# Patient Record
Sex: Female | Born: 1935 | Race: White | Hispanic: No | Marital: Married | Smoking: Former smoker
Health system: Southern US, Community
[De-identification: ages and names within clinical notes are randomized; demographics above are authoritative.]

## PROBLEM LIST (undated history)

## (undated) DIAGNOSIS — I729 Aneurysm of unspecified site: Secondary | ICD-10-CM

## (undated) DIAGNOSIS — S99919A Unspecified injury of unspecified ankle, initial encounter: Secondary | ICD-10-CM

## (undated) HISTORY — DX: Aneurysm of unspecified site: I72.9

## (undated) HISTORY — DX: Unspecified injury of unspecified ankle, initial encounter: S99.919A

---

## 1982-05-11 HISTORY — PX: TOTAL ABDOMINAL HYSTERECTOMY: SHX209

## 2010-10-13 ENCOUNTER — Ambulatory Visit: Payer: Self-pay | Admitting: Internal Medicine

## 2011-10-14 DIAGNOSIS — Z0000 Encounter for general adult medical examination without abnormal findings: Secondary | ICD-10-CM | POA: Diagnosis not present

## 2012-06-16 IMAGING — CR DG CHEST 2V
1 series · 2 of 2 positions shown · non-contrast
Comparison: none

REASON FOR EXAM: abnormal chest exam
COMMENTS:

[Series 1: view not recorded · 0.17mm/px · 2 of 2 slices shown]
[im 1/2]
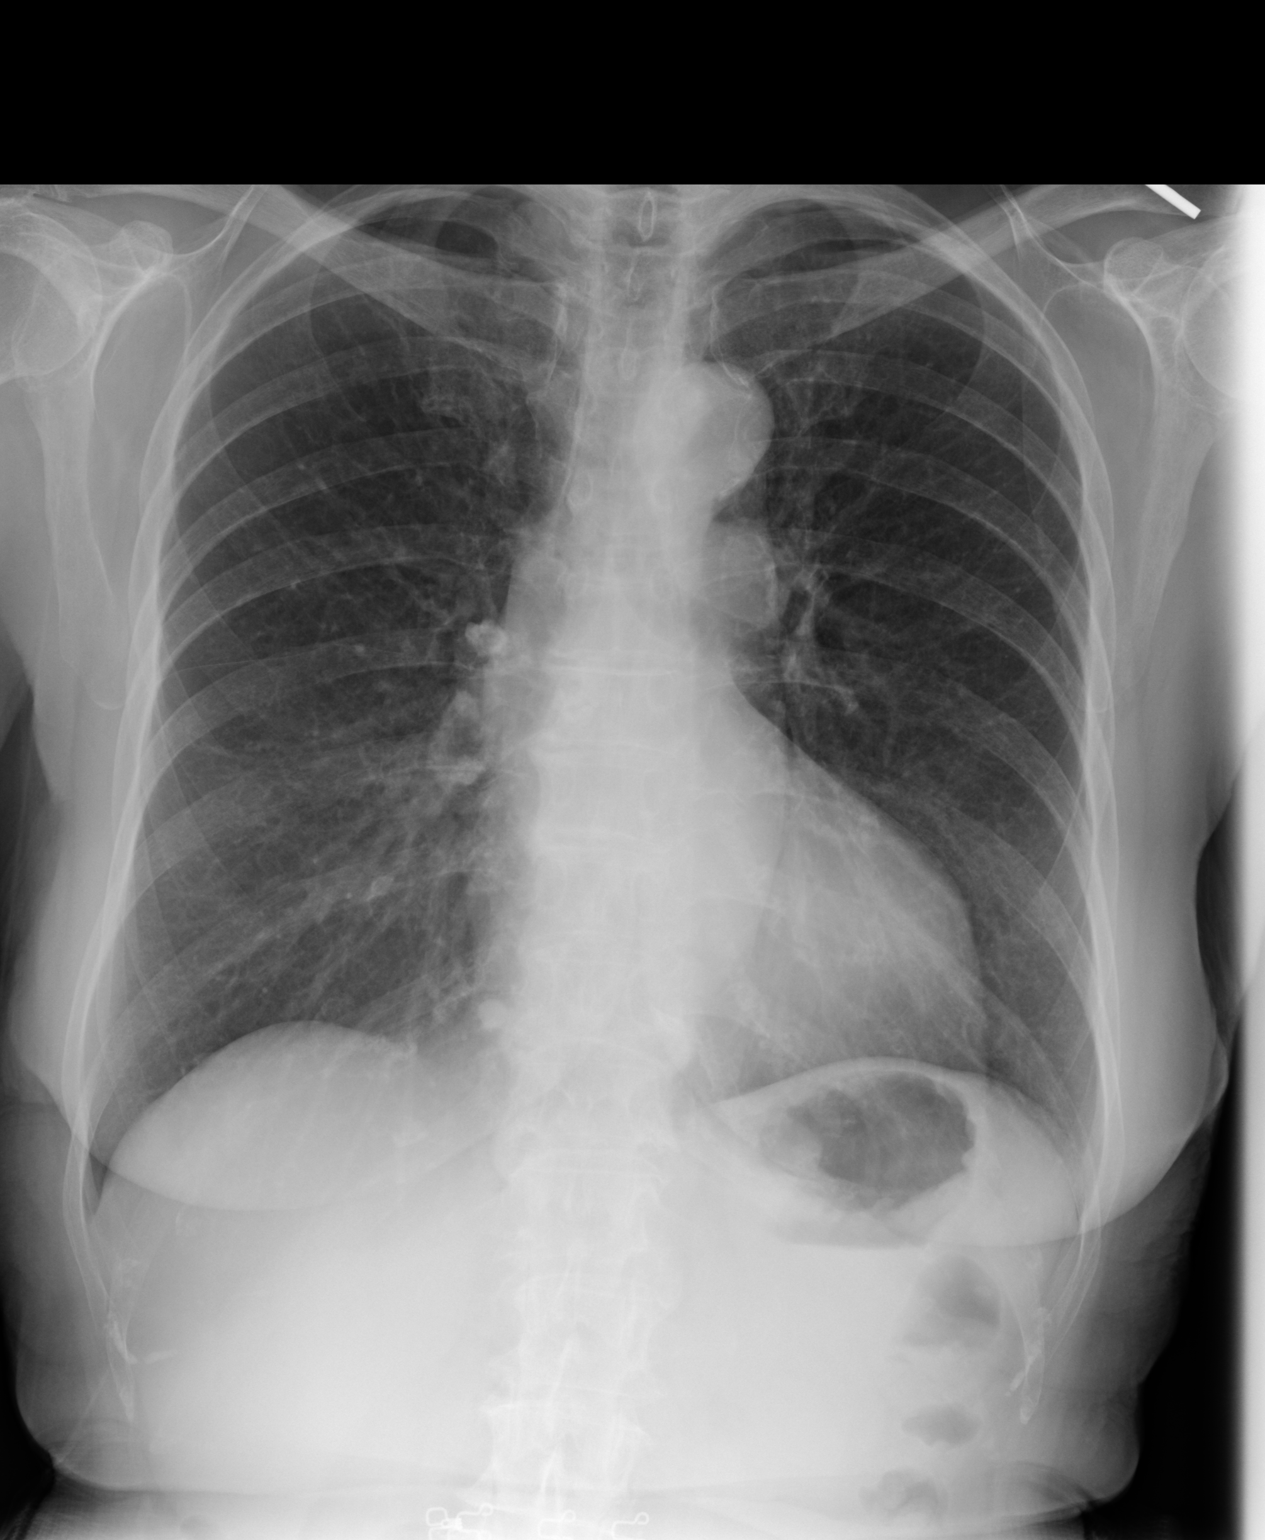
[im 2/2]
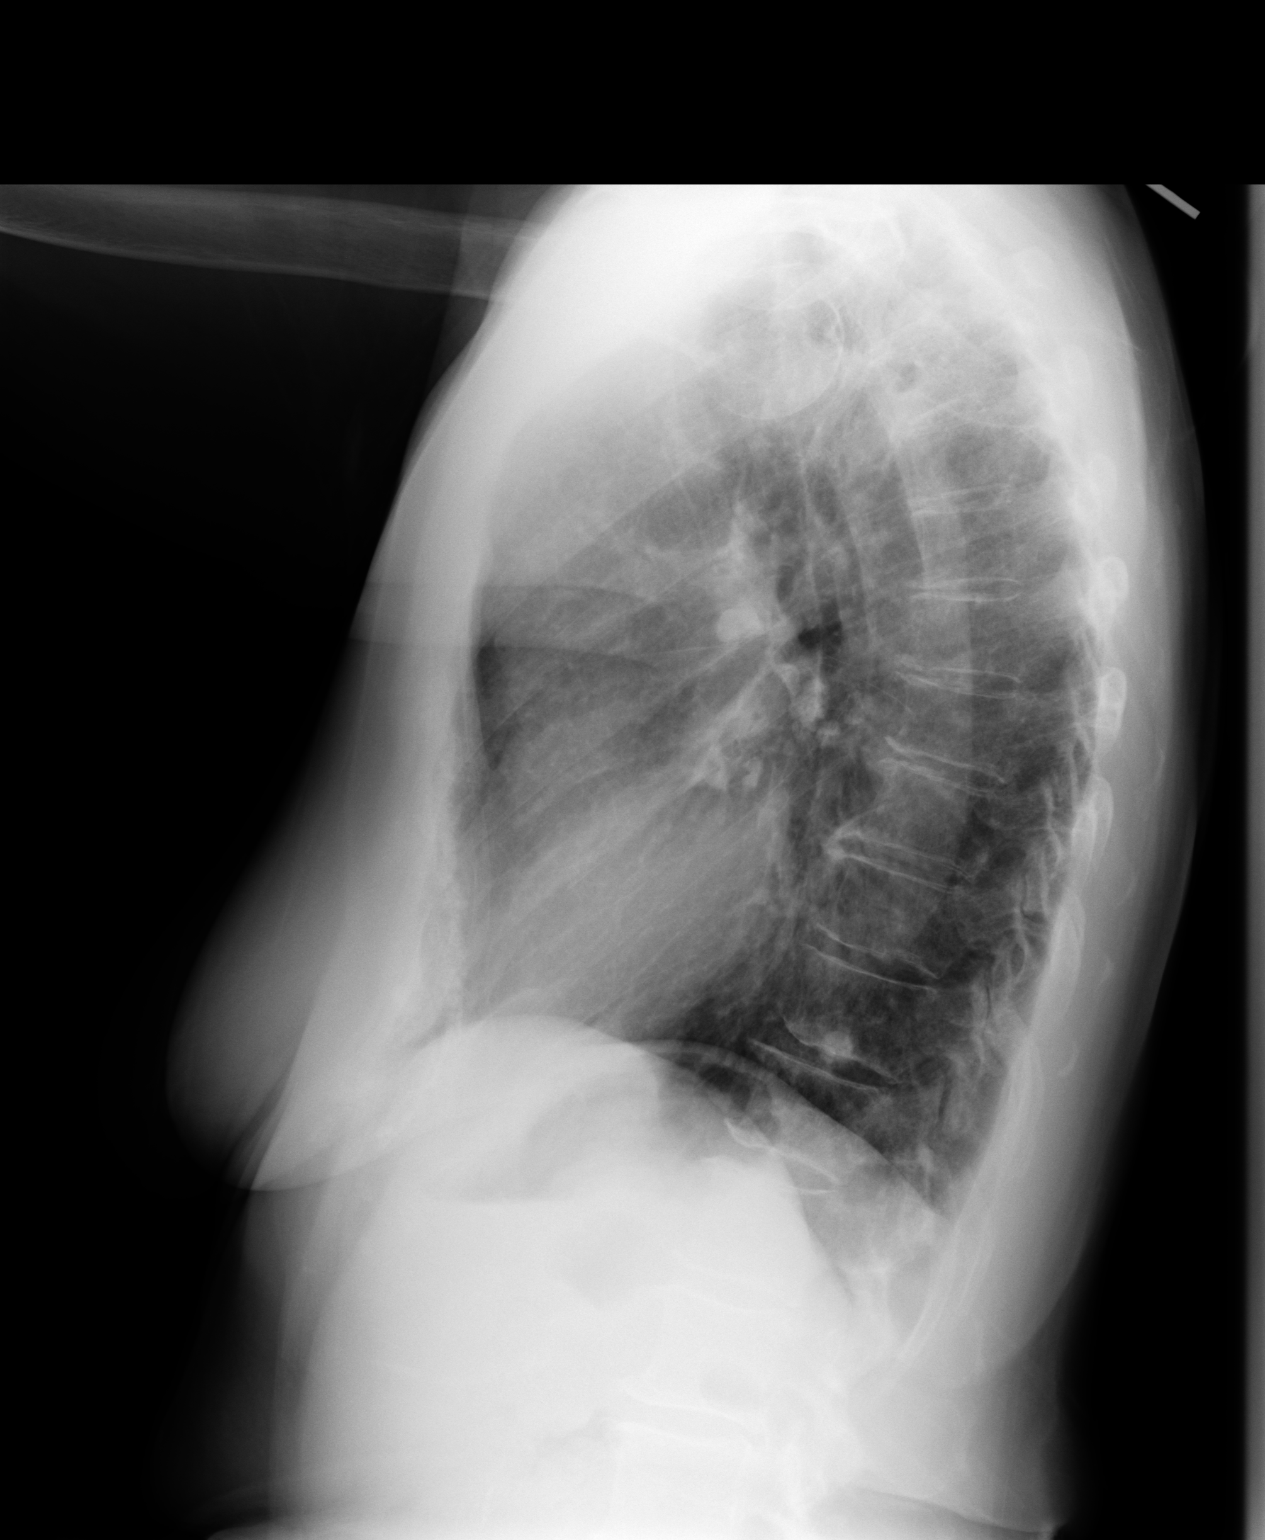

[2 of 2 positions shown; findings below may reference images not displayed]

PROCEDURE:     MDR - MDR CHEST PA(OR AP) AND LATERAL  - October 13, 2010 [DATE]

RESULT:     The lung fields are clear. No pneumonia, pneumothorax or pleural
effusion is seen. No acute changes of the heart or pulmonary vasculature are
identified. The osseous structures are normal in appearance.

Incidental note is made of a few calcified granulomas about the right hilum
and posteriorly in the right lung base.
IMPRESSION: 1.     No acute changes are identified.

## 2013-02-21 DIAGNOSIS — E785 Hyperlipidemia, unspecified: Secondary | ICD-10-CM | POA: Diagnosis not present

## 2013-02-21 DIAGNOSIS — R3129 Other microscopic hematuria: Secondary | ICD-10-CM | POA: Diagnosis not present

## 2013-02-21 DIAGNOSIS — I10 Essential (primary) hypertension: Secondary | ICD-10-CM | POA: Diagnosis not present

## 2013-02-21 DIAGNOSIS — Z23 Encounter for immunization: Secondary | ICD-10-CM | POA: Diagnosis not present

## 2013-02-21 DIAGNOSIS — Z0000 Encounter for general adult medical examination without abnormal findings: Secondary | ICD-10-CM | POA: Diagnosis not present

## 2013-03-16 DIAGNOSIS — Z1231 Encounter for screening mammogram for malignant neoplasm of breast: Secondary | ICD-10-CM | POA: Diagnosis not present

## 2013-05-01 DIAGNOSIS — H25099 Other age-related incipient cataract, unspecified eye: Secondary | ICD-10-CM | POA: Diagnosis not present

## 2013-06-20 DIAGNOSIS — I10 Essential (primary) hypertension: Secondary | ICD-10-CM | POA: Diagnosis not present

## 2014-02-23 DIAGNOSIS — E784 Other hyperlipidemia: Secondary | ICD-10-CM | POA: Diagnosis not present

## 2014-02-23 DIAGNOSIS — Z0000 Encounter for general adult medical examination without abnormal findings: Secondary | ICD-10-CM | POA: Diagnosis not present

## 2014-02-23 DIAGNOSIS — I10 Essential (primary) hypertension: Secondary | ICD-10-CM | POA: Diagnosis not present

## 2014-02-23 DIAGNOSIS — N289 Disorder of kidney and ureter, unspecified: Secondary | ICD-10-CM | POA: Diagnosis not present

## 2014-02-23 DIAGNOSIS — D485 Neoplasm of uncertain behavior of skin: Secondary | ICD-10-CM | POA: Diagnosis not present

## 2014-02-23 DIAGNOSIS — I639 Cerebral infarction, unspecified: Secondary | ICD-10-CM | POA: Diagnosis not present

## 2014-02-23 DIAGNOSIS — Z23 Encounter for immunization: Secondary | ICD-10-CM | POA: Diagnosis not present

## 2014-02-23 LAB — CBC AND DIFFERENTIAL: HEMOGLOBIN: 13.8 g/dL (ref 12.0–16.0)

## 2014-02-23 LAB — BASIC METABOLIC PANEL
BUN: 23 mg/dL — AB (ref 4–21)
Creatinine: 1.2 mg/dL — AB (ref <=1.1)

## 2014-02-23 LAB — TSH: TSH: 1.15 u[IU]/mL (ref <=5.90)

## 2014-03-23 DIAGNOSIS — Z1231 Encounter for screening mammogram for malignant neoplasm of breast: Secondary | ICD-10-CM | POA: Diagnosis not present

## 2014-03-23 LAB — HM MAMMOGRAPHY: HM MAMMO: NORMAL

## 2014-03-26 DIAGNOSIS — L570 Actinic keratosis: Secondary | ICD-10-CM | POA: Diagnosis not present

## 2014-03-26 DIAGNOSIS — D485 Neoplasm of uncertain behavior of skin: Secondary | ICD-10-CM | POA: Diagnosis not present

## 2014-03-26 DIAGNOSIS — X32XXXA Exposure to sunlight, initial encounter: Secondary | ICD-10-CM | POA: Diagnosis not present

## 2014-03-26 DIAGNOSIS — Z23 Encounter for immunization: Secondary | ICD-10-CM | POA: Diagnosis not present

## 2014-03-26 DIAGNOSIS — D225 Melanocytic nevi of trunk: Secondary | ICD-10-CM | POA: Diagnosis not present

## 2014-03-26 DIAGNOSIS — L821 Other seborrheic keratosis: Secondary | ICD-10-CM | POA: Diagnosis not present

## 2014-03-26 DIAGNOSIS — Z7189 Other specified counseling: Secondary | ICD-10-CM | POA: Diagnosis not present

## 2014-03-26 DIAGNOSIS — F17200 Nicotine dependence, unspecified, uncomplicated: Secondary | ICD-10-CM | POA: Diagnosis not present

## 2014-05-10 DIAGNOSIS — H527 Unspecified disorder of refraction: Secondary | ICD-10-CM | POA: Diagnosis not present

## 2014-05-10 DIAGNOSIS — H2513 Age-related nuclear cataract, bilateral: Secondary | ICD-10-CM | POA: Diagnosis not present

## 2014-10-18 DIAGNOSIS — D485 Neoplasm of uncertain behavior of skin: Secondary | ICD-10-CM | POA: Diagnosis not present

## 2014-10-18 DIAGNOSIS — D2261 Melanocytic nevi of right upper limb, including shoulder: Secondary | ICD-10-CM | POA: Diagnosis not present

## 2014-10-18 DIAGNOSIS — Z7189 Other specified counseling: Secondary | ICD-10-CM | POA: Diagnosis not present

## 2014-10-18 DIAGNOSIS — D225 Melanocytic nevi of trunk: Secondary | ICD-10-CM | POA: Diagnosis not present

## 2014-10-18 DIAGNOSIS — L821 Other seborrheic keratosis: Secondary | ICD-10-CM | POA: Diagnosis not present

## 2014-10-18 DIAGNOSIS — L820 Inflamed seborrheic keratosis: Secondary | ICD-10-CM | POA: Diagnosis not present

## 2015-02-24 ENCOUNTER — Other Ambulatory Visit: Payer: Self-pay | Admitting: Internal Medicine

## 2015-02-24 ENCOUNTER — Encounter: Payer: Self-pay | Admitting: Internal Medicine

## 2015-02-24 DIAGNOSIS — E785 Hyperlipidemia, unspecified: Secondary | ICD-10-CM | POA: Insufficient documentation

## 2015-02-24 DIAGNOSIS — Z720 Tobacco use: Secondary | ICD-10-CM | POA: Insufficient documentation

## 2015-02-24 DIAGNOSIS — I10 Essential (primary) hypertension: Secondary | ICD-10-CM | POA: Insufficient documentation

## 2015-02-24 DIAGNOSIS — G459 Transient cerebral ischemic attack, unspecified: Secondary | ICD-10-CM

## 2015-02-24 DIAGNOSIS — N289 Disorder of kidney and ureter, unspecified: Secondary | ICD-10-CM | POA: Insufficient documentation

## 2015-02-24 DIAGNOSIS — I749 Embolism and thrombosis of unspecified artery: Secondary | ICD-10-CM | POA: Insufficient documentation

## 2015-02-24 DIAGNOSIS — R319 Hematuria, unspecified: Secondary | ICD-10-CM

## 2015-02-24 DIAGNOSIS — N029 Recurrent and persistent hematuria with unspecified morphologic changes: Secondary | ICD-10-CM | POA: Insufficient documentation

## 2015-04-16 ENCOUNTER — Ambulatory Visit (INDEPENDENT_AMBULATORY_CARE_PROVIDER_SITE_OTHER): Payer: Medicare Other | Admitting: Internal Medicine

## 2015-04-16 ENCOUNTER — Encounter: Payer: Self-pay | Admitting: Internal Medicine

## 2015-04-16 VITALS — BP 138/88 | HR 80 | Ht 62.5 in | Wt 140.6 lb

## 2015-04-16 DIAGNOSIS — E785 Hyperlipidemia, unspecified: Secondary | ICD-10-CM

## 2015-04-16 DIAGNOSIS — I749 Embolism and thrombosis of unspecified artery: Secondary | ICD-10-CM | POA: Diagnosis not present

## 2015-04-16 DIAGNOSIS — N289 Disorder of kidney and ureter, unspecified: Secondary | ICD-10-CM | POA: Diagnosis not present

## 2015-04-16 DIAGNOSIS — Z0000 Encounter for general adult medical examination without abnormal findings: Secondary | ICD-10-CM | POA: Diagnosis not present

## 2015-04-16 DIAGNOSIS — G459 Transient cerebral ischemic attack, unspecified: Secondary | ICD-10-CM | POA: Diagnosis not present

## 2015-04-16 DIAGNOSIS — N029 Recurrent and persistent hematuria with unspecified morphologic changes: Secondary | ICD-10-CM | POA: Diagnosis not present

## 2015-04-16 DIAGNOSIS — Z720 Tobacco use: Secondary | ICD-10-CM

## 2015-04-16 DIAGNOSIS — I10 Essential (primary) hypertension: Secondary | ICD-10-CM

## 2015-04-16 DIAGNOSIS — I499 Cardiac arrhythmia, unspecified: Secondary | ICD-10-CM

## 2015-04-16 DIAGNOSIS — R319 Hematuria, unspecified: Secondary | ICD-10-CM

## 2015-04-16 MED ORDER — VALSARTAN-HYDROCHLOROTHIAZIDE 80-12.5 MG PO TABS: 1.0000 | ORAL_TABLET | Freq: Every day | ORAL | Status: DC

## 2015-04-16 NOTE — Progress Notes (Signed)
Patient: Diana Padilla, Female    DOB: 12/30/1934, 80 y.o.   MRN: [id number] Visit Date: 04/16/2015  Today's Provider: Halina Maidens, MD   Chief Complaint  Patient presents with  . Medicare Wellness  . Hypertension  . Hyperlipidemia   Subjective:    Annual wellness visit Diana Padilla is a 80 y.o. female who presents today for her Subsequent Annual Wellness Visit. She feels well. She reports exercising regularly - walking. She reports she is sleeping well. She remains active with housework, church and volunteer activities.  She denies breast problems and is due for a mammogram. Since her husband passed away, she no longer goes to [place] for the winter.  ----------------------------------------------------------- Hypertension This is a chronic problem. The current episode started more than 1 year ago. The problem is unchanged. The problem is controlled (range 120-130 syst). Associated symptoms include palpitations. Pertinent negatives include no chest pain, headaches, peripheral edema or shortness of breath. Past treatments include angiotensin blockers and diuretics. The current treatment provides significant improvement.  Hyperlipidemia This is a chronic problem. The current episode started more than 1 year ago. The problem is uncontrolled. She has no history of diabetes or hypothyroidism. Pertinent negatives include no chest pain or shortness of breath. Current antihyperlipidemic treatment includes diet change. Risk factors: hx of TIA.    Review of Systems  Constitutional: Negative for chills, diaphoresis, fatigue and unexpected weight change.  HENT: Positive for dental problem (receeding gums). Negative for hearing loss, tinnitus, trouble swallowing and voice change.   Eyes: Negative for visual disturbance.  Respiratory: Negative for choking, chest tightness and shortness of breath.   Cardiovascular: Positive for palpitations. Negative for chest pain and leg swelling.   Gastrointestinal: Negative for abdominal pain, diarrhea, constipation and blood in stool.  Genitourinary: Positive for urgency. Negative for dysuria, hematuria, vaginal bleeding and vaginal discharge.  Musculoskeletal: Positive for back pain.  Skin: Negative for color change and rash.  Neurological: Negative for tremors, seizures, speech difficulty, weakness, numbness and headaches.  Hematological: Negative for adenopathy. Does not bruise/bleed easily.  Psychiatric/Behavioral: Negative for sleep disturbance, dysphoric mood and decreased concentration.    Social History   Social History  . Marital Status: Married    Spouse Name: N/A  . Number of Children: N/A  . Years of Education: N/A   Occupational History  . Not on file.   Social History Main Topics  . Smoking status: Current Every Day Smoker  . Smokeless tobacco: Not on file  . Alcohol Use: No  . Drug Use: Not on file  . Sexual Activity: Not on file   Other Topics Concern  . Not on file   Social History Narrative    Patient Active Problem List   Diagnosis Date Noted  . Benign essential hematuria 02/24/2015  . Dyslipidemia 02/24/2015  . Current tobacco use 02/24/2015  . Essential (primary) hypertension 02/24/2015  . Transient ischemic attack due to embolism ([place]) 02/24/2015  . Impaired renal function 02/24/2015    Past Surgical History  Procedure Laterality Date  . Total abdominal hysterectomy  1984    fibroids    Her family history includes Parkinson's disease in her father.    Previous Medications   ASPIRIN 81 MG CHEWABLE TABLET    Chew 1 tablet by mouth daily.   CALCIUM CITRATE-VITAMIN D (CALCIUM + D PO)    Take 1 tablet by mouth daily.   MULTIPLE VITAMINS-MINERALS (CENTRUM SILVER ULTRA WOMENS) TABS    Take 1 tablet by mouth daily.  VITAMIN E 1000 UNIT CAPSULE    Take 1 capsule by mouth daily.    No care team member to display     Objective:   Vitals: BP 138/88 mmHg  Pulse 80  Ht 5' 2.5" (1.588  m)  Wt 140 lb 9.6 oz (63.776 kg)  BMI 25.29 kg/m2  Physical Exam  Constitutional: She is oriented to person, place, and time. She appears well-developed and well-nourished. No distress.  HENT:  Head: Normocephalic and atraumatic.  Right Ear: Tympanic membrane and ear canal normal.  Left Ear: Tympanic membrane and ear canal normal.  Nose: Right sinus exhibits no maxillary sinus tenderness. Left sinus exhibits no maxillary sinus tenderness.  Mouth/Throat: Uvula is midline and oropharynx is clear and moist.  Eyes: Conjunctivae and EOM are normal. Right eye exhibits no discharge. Left eye exhibits no discharge. No scleral icterus.  Neck: Normal range of motion. Carotid bruit is not present. No erythema present. No thyromegaly present.  Cardiovascular: Normal rate, regular rhythm, normal heart sounds, intact distal pulses and normal pulses.  Frequent extrasystoles are present.  Pulmonary/Chest: Effort normal and breath sounds normal. No respiratory distress. She has no wheezes. Right breast exhibits no mass, no nipple discharge, no skin change and no tenderness. Left breast exhibits no mass, no nipple discharge, no skin change and no tenderness.  Abdominal: Soft. Bowel sounds are normal. There is no hepatosplenomegaly. There is no tenderness. There is no CVA tenderness.  Musculoskeletal: Normal range of motion.  Lymphadenopathy:    She has no cervical adenopathy.    She has no axillary adenopathy.  Neurological: She is alert and oriented to person, place, and time. She has normal reflexes. No cranial nerve deficit or sensory deficit.  Skin: Skin is warm, dry and intact. No rash noted.  Psychiatric: She has a normal mood and affect. Her speech is normal and behavior is normal. Thought content normal. Cognition and memory are normal.  Nursing note and vitals reviewed.   Activities of Daily Living In your present state of health, do you have any difficulty performing the following activities:  04/16/2015  Hearing? N  Vision? N  Difficulty concentrating or making decisions? N  Walking or climbing stairs? N  Dressing or bathing? N  Doing errands, shopping? N    Fall Risk Assessment Fall Risk  04/16/2015  Falls in the past year? No     Patient reports there are safety devices in place in shower at home.   Depression Screen PHQ 2/9 Scores 04/16/2015  PHQ - 2 Score 0    Cognitive Testing - 6-CIT   Correct? Score   What year is it? yes 0 Yes = 0    No = 4  What month is it? yes 0 Yes = 0    No = 3  Remember:     Pia [address]au, [place], [place]     What time is it? yes 0 Yes = 0    No = 3  Count backwards from 20 to 1 yes 0 Correct = 0    1 error = 2   More than 1 error = 4  Say the months of the year in reverse. yes 0 Correct = 0    1 error = 2   More than 1 error = 4  What address did I ask you to remember? no 2 Correct = 0  1 error = 2    2 error = 4    3 error =  6    4 error = 8    All wrong = 10       TOTAL SCORE  2/28   Interpretation:  Normal  Normal (0-7) Abnormal (8-28)        Assessment & Plan:     Annual Wellness Visit  Reviewed patient's Family Medical History Reviewed and updated list of patient's medical providers Assessment of cognitive impairment was done Assessed patient's functional ability Established a written schedule for health screening [place] Completed and Reviewed  Exercise Activities and Dietary recommendations Goals    . Quit smoking / using tobacco       Immunization History  Administered Date(s) Administered  . Influenza-Unspecified 02/15/2015  . Pneumococcal Conjugate-13 02/23/2014  . Pneumococcal Polysaccharide-23 03/11/1998, 05/12/2002  . Zoster 05/12/1998    Health Maintenance  Topic Date Due  . Samul Dada  11/29/1953  . DEXA SCAN  11/28/2001  . INFLUENZA VACCINE  12/10/2015  . ZOSTAVAX  Addressed  . PNA vac Low Risk Adult  Completed     Discussed health benefits of physical  activity, and encouraged her to engage in regular exercise appropriate for her age and condition.    ------------------------------------------------------------------------------------------------------------ 1. Medicare annual wellness visit, subsequent Medicare wellness measures are satisfied - POCT urinalysis dipstick  2. Irregular heart beat EKG shows only frequent PACs otherwise sinus rhythm at 74 - EKG 12-Lead - TSH  3. Essential (primary) hypertension controlled - CBC with Differential/Platelet - Comprehensive metabolic panel - valsartan-hydrochlorothiazide (DIOVAN HCT) 80-12.5 MG tablet; Take 1 tablet by mouth daily.  Dispense: 90 tablet; Refill: 3  4. Transient ischemic attack due to embolism (HCC) No recurrent symptoms Continue daily aspirin therapy  5. Dyslipidemia Patient has been intolerant of all statin therapy Continue low-fat diet and daily fish oil - Lipid panel  6. Current tobacco use Smoking 4-5 cigarettes per day; patient strongly encouraged to quit completely  7. Impaired renal function We will check chemistries  8. Benign essential hematuria Stable without symptoms of infection   Halina Maidens, MD [facility]  04/16/2015

## 2015-04-16 NOTE — Patient Instructions (Addendum)
Health Maintenance  Topic Date Due  . TETANUS/TDAP  11/29/1955  . ZOSTAVAX  11/28/1996  . DEXA SCAN  11/28/2001  . INFLUENZA VACCINE  12/10/2015  . PNA vac Low Risk Adult  Completed   Breast Self-Awareness Practicing breast self-awareness may pick up problems early, prevent significant medical complications, and possibly save your life. By practicing breast self-awareness, you can become familiar with how your breasts look and feel and if your breasts are changing. This allows you to notice changes early. It can also offer you some reassurance that your breast health is good. One way to learn what is normal for your breasts and whether your breasts are changing is to do a breast self-exam. If you find a lump or something that was not present in the past, it is best to contact your caregiver right away. Other findings that should be evaluated by your caregiver include nipple discharge, especially if it is bloody; skin changes or reddening; areas where the skin seems to be pulled in (retracted); or new lumps and bumps. Breast pain is seldom associated with cancer (malignancy), but should also be evaluated by a caregiver. HOW TO PERFORM A BREAST SELF-EXAM The best time to examine your breasts is 5-7 days after your menstrual period is over. During menstruation, the breasts are lumpier, and it may be more difficult to pick up changes. If you do not menstruate, have reached menopause, or had your uterus removed (hysterectomy), you should examine your breasts at regular intervals, such as monthly. If you are breastfeeding, examine your breasts after a feeding or after using a breast pump. Breast implants do not decrease the risk for lumps or tumors, so continue to perform breast self-exams as recommended. Talk to your caregiver about how to determine the difference between the implant and breast tissue. Also, talk about the amount of pressure you should use during the exam. Over time, you will become more  familiar with the variations of your breasts and more comfortable with the exam. A breast self-exam requires you to remove all your clothes above the waist. 1. Look at your breasts and nipples. Stand in front of a mirror in a room with good lighting. With your hands on your hips, push your hands firmly downward. Look for a difference in shape, contour, and size from one breast to the other (asymmetry). Asymmetry includes puckers, dips, or bumps. Also, look for skin changes, such as reddened or scaly areas on the breasts. Look for nipple changes, such as discharge, dimpling, repositioning, or redness. 2. Carefully feel your breasts. This is best done either in the shower or tub while using soapy water or when flat on your back. Place the arm (on the side of the breast you are examining) above your head. Use the pads (not the fingertips) of your three middle fingers on your opposite hand to feel your breasts. Start in the underarm area and use  inch (2 cm) overlapping circles to feel your breast. Use 3 different levels of pressure (light, medium, and firm pressure) at each circle before moving to the next circle. The light pressure is needed to feel the tissue closest to the skin. The medium pressure will help to feel breast tissue a little deeper, while the firm pressure is needed to feel the tissue close to the ribs. Continue the overlapping circles, moving downward over the breast until you feel your ribs below your breast. Then, move one finger-width towards the center of the body. Continue to use the  inch (2 cm) overlapping circles to feel your breast as you move slowly up toward the collar bone (clavicle) near the base of the neck. Continue the up and down exam using all 3 pressures until you reach the middle of the chest. Do this with each breast, carefully feeling for lumps or changes. 3.  Keep a written record with breast changes or normal findings for each breast. By writing this information down, you  do not need to depend only on memory for size, tenderness, or location. Write down where you are in your menstrual cycle, if you are still menstruating. Breast tissue can have some lumps or thick tissue. However, see your caregiver if you find anything that concerns you.  SEEK MEDICAL CARE IF:  You see a change in shape, contour, or size of your breasts or nipples.   You see skin changes, such as reddened or scaly areas on the breasts or nipples.   You have an unusual discharge from your nipples.   You feel a new lump or unusually thick areas.    This information is not intended to replace advice given to you by your health care provider. Make sure you discuss any questions you have with your health care provider.   Document Released: 04/27/2005 Document Revised: 04/13/2012 Document Reviewed: 08/12/2011 Elsevier Interactive Patient Education [employer].

## 2015-04-17 LAB — TSH: TSH: 1.59 u[IU]/mL (ref 0.450–4.500)

## 2015-04-17 LAB — LIPID PANEL
CHOL/HDL RATIO: 3.3 ratio (ref 0.0–4.4)
CHOLESTEROL TOTAL: 228 mg/dL — AB (ref 100–199)
HDL: 69 mg/dL (ref >39)
LDL CALC: 142 mg/dL — AB (ref 0–99)
Triglycerides: 84 mg/dL (ref 0–149)
VLDL CHOLESTEROL CAL: 17 mg/dL (ref 5–40)

## 2015-04-17 LAB — COMPREHENSIVE METABOLIC PANEL
ALBUMIN: 4.3 g/dL (ref 3.5–4.8)
ALT: 12 IU/L (ref 0–32)
AST: 19 IU/L (ref 0–40)
Albumin/Globulin Ratio: 1.4 (ref 1.1–2.5)
Alkaline Phosphatase: 71 IU/L (ref 39–117)
BUN / CREAT RATIO: 19 (ref 11–26)
BUN: 21 mg/dL (ref 8–27)
Bilirubin Total: 0.7 mg/dL (ref 0.0–1.2)
CO2: 26 mmol/L (ref 18–29)
CREATININE: 1.08 mg/dL — AB (ref 0.57–1.00)
Calcium: 9.8 mg/dL (ref 8.7–10.3)
Chloride: 97 mmol/L (ref 97–106)
GFR, EST AFRICAN AMERICAN: 57 mL/min/{1.73_m2} — AB (ref >59)
GFR, EST NON AFRICAN AMERICAN: 49 mL/min/{1.73_m2} — AB (ref >59)
GLOBULIN, TOTAL: 3.1 g/dL (ref 1.5–4.5)
GLUCOSE: 93 mg/dL (ref 65–99)
Potassium: 3.6 mmol/L (ref 3.5–5.2)
SODIUM: 141 mmol/L (ref 136–144)
TOTAL PROTEIN: 7.4 g/dL (ref 6.0–8.5)

## 2015-04-17 LAB — CBC WITH DIFFERENTIAL/PLATELET
BASOS ABS: 0 10*3/uL (ref 0.0–0.2)
Basos: 0 %
EOS (ABSOLUTE): 0.1 10*3/uL (ref 0.0–0.4)
EOS: 2 %
HEMATOCRIT: 37.6 % (ref 34.0–46.6)
HEMOGLOBIN: 12.4 g/dL (ref 11.1–15.9)
Immature Grans (Abs): 0 10*3/uL (ref 0.0–0.1)
Immature Granulocytes: 0 %
LYMPHS ABS: 2.3 10*3/uL (ref 0.7–3.1)
Lymphs: 27 %
MCH: 29.1 pg (ref 26.6–33.0)
MCHC: 33 g/dL (ref 31.5–35.7)
MCV: 88 fL (ref 79–97)
MONOS ABS: 0.6 10*3/uL (ref 0.1–0.9)
Monocytes: 7 %
NEUTROS ABS: 5.3 10*3/uL (ref 1.4–7.0)
Neutrophils: 64 %
Platelets: 259 10*3/uL (ref 150–379)
RBC: 4.26 x10E6/uL (ref 3.77–5.28)
RDW: 14.1 % (ref 12.3–15.4)
WBC: 8.3 10*3/uL (ref 3.4–10.8)

## 2015-04-19 LAB — POCT URINALYSIS DIPSTICK
Bilirubin, UA: NEGATIVE
Blood, UA: NEGATIVE
Glucose, UA: NEGATIVE
KETONES UA: NEGATIVE
LEUKOCYTES UA: NEGATIVE
NITRITE UA: NEGATIVE
PROTEIN UA: NEGATIVE
Spec Grav, UA: <=1.005
UROBILINOGEN UA: 0.2
pH, UA: 5

## 2015-05-07 DIAGNOSIS — Z1231 Encounter for screening mammogram for malignant neoplasm of breast: Secondary | ICD-10-CM | POA: Diagnosis not present

## 2015-05-29 DIAGNOSIS — Z1389 Encounter for screening for other disorder: Secondary | ICD-10-CM | POA: Diagnosis not present

## 2015-05-29 DIAGNOSIS — Z8669 Personal history of other diseases of the nervous system and sense organs: Secondary | ICD-10-CM | POA: Diagnosis not present

## 2015-05-29 DIAGNOSIS — I10 Essential (primary) hypertension: Secondary | ICD-10-CM | POA: Diagnosis not present

## 2015-05-29 DIAGNOSIS — R0989 Other specified symptoms and signs involving the circulatory and respiratory systems: Secondary | ICD-10-CM | POA: Diagnosis not present

## 2015-05-29 DIAGNOSIS — R41841 Cognitive communication deficit: Secondary | ICD-10-CM | POA: Diagnosis not present

## 2015-05-29 DIAGNOSIS — E559 Vitamin D deficiency, unspecified: Secondary | ICD-10-CM | POA: Diagnosis not present

## 2015-05-29 DIAGNOSIS — F801 Expressive language disorder: Secondary | ICD-10-CM | POA: Diagnosis not present

## 2015-05-29 DIAGNOSIS — R1906 Epigastric swelling, mass or lump: Secondary | ICD-10-CM | POA: Diagnosis not present

## 2015-05-31 ENCOUNTER — Telehealth: Payer: Self-pay | Admitting: Internal Medicine

## 2015-05-31 ENCOUNTER — Encounter: Payer: Self-pay | Admitting: Internal Medicine

## 2015-05-31 NOTE — Telephone Encounter (Signed)
Telephone call from patient's daughter Hal Hope who is listed as a contact. Ms. Barbarito was seen at [facility] geriatrics evaluation clinic. Her blood pressure was fairly low at a systolic of 90. Because of her word finding difficulty they felt that her low blood pressure could be contributing. They did not want to adjust her medication but recommend that she follow-up with me. That appointment has not yet been scheduled. The geriatrician also felt that Diana Padilla had a pulsatile abdominal mass. Because of her smoking history and thought it should be further evaluated. I spoke with Candace regarding these issues and recommended that she hold her blood pressure medication. She should follow-up with me in 1 month to reassess blood pressure and to assess the possible abdominal mass.

## 2015-06-03 DIAGNOSIS — H527 Unspecified disorder of refraction: Secondary | ICD-10-CM | POA: Diagnosis not present

## 2015-06-03 DIAGNOSIS — H2513 Age-related nuclear cataract, bilateral: Secondary | ICD-10-CM | POA: Diagnosis not present

## 2015-06-12 DIAGNOSIS — H903 Sensorineural hearing loss, bilateral: Secondary | ICD-10-CM | POA: Diagnosis not present

## 2015-07-04 ENCOUNTER — Ambulatory Visit (INDEPENDENT_AMBULATORY_CARE_PROVIDER_SITE_OTHER): Payer: Medicare Other | Admitting: Internal Medicine

## 2015-07-04 ENCOUNTER — Encounter: Payer: Self-pay | Admitting: Internal Medicine

## 2015-07-04 VITALS — BP 133/80 | HR 73 | Ht 62.5 in | Wt 140.6 lb

## 2015-07-04 DIAGNOSIS — R1900 Intra-abdominal and pelvic swelling, mass and lump, unspecified site: Secondary | ICD-10-CM

## 2015-07-04 DIAGNOSIS — N289 Disorder of kidney and ureter, unspecified: Secondary | ICD-10-CM

## 2015-07-04 DIAGNOSIS — I749 Embolism and thrombosis of unspecified artery: Secondary | ICD-10-CM | POA: Diagnosis not present

## 2015-07-04 DIAGNOSIS — I10 Essential (primary) hypertension: Secondary | ICD-10-CM | POA: Diagnosis not present

## 2015-07-04 DIAGNOSIS — Z720 Tobacco use: Secondary | ICD-10-CM

## 2015-07-04 DIAGNOSIS — G459 Transient cerebral ischemic attack, unspecified: Secondary | ICD-10-CM

## 2015-07-04 MED ORDER — VALSARTAN 80 MG PO TABS: 80.0000 mg | ORAL_TABLET | Freq: Every day | ORAL | Status: DC

## 2015-07-04 NOTE — Progress Notes (Signed)
Date:  07/04/2015   Name:  Diana Padilla   DOB:  [birth year]   MRN:  [id number]   Chief Complaint: Follow-up and Hypertension Hypertension This is a chronic problem. The current episode started more than 1 year ago. The problem has been waxing and waning since onset. The problem is controlled (systolic bp around [id number] without medication). Pertinent negatives include no chest pain, headaches, palpitations or shortness of breath. Past treatments include angiotensin blockers and diuretics. The current treatment provides significant improvement. There are no compliance problems.    the patient was seen by geriatrics last month. Systolic blood pressure was 90. The patient was advised to reduce her medication by stopping it completely and follow-up with me. Geriatrics also thought she may have a pulsatile abdominal mass and recommended follow-up for that.  Renal insufficiency - avoiding nsaids.  She takes tylenol as needed.  History of TIA - recurrance of symptoms of numbness in right hand.  She does not think it was a TIA but review records suggested TIA.  Lab Results  Component Value Date   CREATININE 1.08* 04/16/2015    Review of Systems  Constitutional: Negative for fever, chills and fatigue.  Respiratory: Negative for cough, chest tightness and shortness of breath.   Cardiovascular: Positive for leg swelling. Negative for chest pain and palpitations.  Gastrointestinal: Negative for abdominal pain, diarrhea, constipation and blood in stool.  Genitourinary: Negative for difficulty urinating.  Musculoskeletal: Negative for arthralgias.  Skin: Negative for pallor and rash.  Neurological: Negative for dizziness, light-headedness and headaches.  Psychiatric/Behavioral: Negative for sleep disturbance and dysphoric mood.    Patient Active Problem List   Diagnosis Date Noted  . Benign essential hematuria 02/24/2015  . Dyslipidemia 02/24/2015  . Current tobacco use 02/24/2015  . Essential  (primary) hypertension 02/24/2015  . Transient ischemic attack due to embolism ([place]) 02/24/2015  . Impaired renal function 02/24/2015    Prior to Admission medications   Medication Sig Start Date End Date Taking? Authorizing Provider  aspirin 81 MG chewable tablet Chew 1 tablet by mouth daily.   Yes Historical Provider, MD  Calcium Citrate-Vitamin D (CALCIUM + D PO) Take 1 tablet by mouth daily.   Yes Historical Provider, MD  Multiple Vitamins-Minerals (CENTRUM SILVER ULTRA WOMENS) TABS Take 1 tablet by mouth daily.   Yes Historical Provider, MD  vitamin E 1000 UNIT capsule Take 1 capsule by mouth daily.   Yes Historical Provider, MD  valsartan-hydrochlorothiazide (DIOVAN HCT) 80-12.5 MG tablet Take 1 tablet by mouth daily. Patient not taking: Reported on 07/04/2015 04/16/15   Glean Hess, MD    Allergies  Allergen Reactions  . Ace Inhibitors Cough  . Amlodipine Swelling  . Morphine Nausea And Vomiting    Past Surgical History  Procedure Laterality Date  . Total abdominal hysterectomy  1984    fibroids    Social History  Substance Use Topics  . Smoking status: Current Every Day Smoker -- 0.25 packs/day for 50 years    Types: Cigarettes  . Smokeless tobacco: None  . Alcohol Use: No   Medication list has been reviewed and updated.   Physical Exam  Constitutional: She is oriented to person, place, and time. She appears well-developed. No distress.  HENT:  Head: Normocephalic and atraumatic.  Neck: Normal range of motion. Neck supple. No thyromegaly present.  Cardiovascular: Normal rate, regular rhythm and normal heart sounds.   Pulmonary/Chest: Effort normal and breath sounds normal. No respiratory distress.  Abdominal: Normal appearance  and bowel sounds are normal. She exhibits pulsatile midline mass. She exhibits no distension, no fluid wave and no abdominal bruit. There is no tenderness.  Musculoskeletal: Normal range of motion. She exhibits no edema or tenderness.    Lymphadenopathy:    She has no cervical adenopathy.  Neurological: She is alert and oriented to person, place, and time.  Skin: Skin is warm and dry. No rash noted.  Psychiatric: She has a normal mood and affect. Her behavior is normal. Thought content normal.    BP 133/80 mmHg  Pulse 73  Ht 5' 2.5" (1.588 m)  Wt 140 lb 9.6 oz (63.776 kg)  BMI 25.29 kg/m2  Assessment and Plan: 1. Essential (primary) hypertension Needs some control - resume valsartan alone - valsartan (DIOVAN) 80 MG tablet; Take 1 tablet (80 mg total) by mouth daily.  Dispense: 30 tablet; Refill: 5 - US Aorta; Future  2. Transient ischemic attack due to embolism (HCC) Continue aspirin daily  3. Impaired renal function Continue to monitor at intervals Patient reminded to take only tylenol as needed  4. Current tobacco use Order sent to [facility] to call patient - US Aorta; Future  5. Pulsatile abdominal mass May also need colonoscopy if [place] negative - patient has declined colonoscopy in the past - US Aorta; Future   Halina Maidens, MD [facility]  07/04/2015

## 2015-07-08 ENCOUNTER — Ambulatory Visit: Payer: Self-pay

## 2015-07-10 ENCOUNTER — Other Ambulatory Visit: Payer: Self-pay | Admitting: Internal Medicine

## 2015-07-10 DIAGNOSIS — I714 Abdominal aortic aneurysm, without rupture, unspecified: Secondary | ICD-10-CM

## 2015-07-10 DIAGNOSIS — I729 Aneurysm of unspecified site: Secondary | ICD-10-CM

## 2015-07-10 HISTORY — DX: Aneurysm of unspecified site: I72.9

## 2015-07-10 HISTORY — PX: ABDOMINAL AORTIC ANEURYSM REPAIR: SUR1152

## 2015-07-16 DIAGNOSIS — I701 Atherosclerosis of renal artery: Secondary | ICD-10-CM | POA: Diagnosis not present

## 2015-07-16 DIAGNOSIS — I714 Abdominal aortic aneurysm, without rupture: Secondary | ICD-10-CM | POA: Diagnosis not present

## 2015-07-19 DIAGNOSIS — I714 Abdominal aortic aneurysm, without rupture: Secondary | ICD-10-CM | POA: Diagnosis not present

## 2015-07-22 DIAGNOSIS — I714 Abdominal aortic aneurysm, without rupture: Secondary | ICD-10-CM | POA: Diagnosis not present

## 2015-07-25 DIAGNOSIS — Z87891 Personal history of nicotine dependence: Secondary | ICD-10-CM | POA: Diagnosis not present

## 2015-07-25 DIAGNOSIS — Z90710 Acquired absence of both cervix and uterus: Secondary | ICD-10-CM | POA: Diagnosis not present

## 2015-07-25 DIAGNOSIS — I10 Essential (primary) hypertension: Secondary | ICD-10-CM | POA: Diagnosis present

## 2015-07-25 DIAGNOSIS — I714 Abdominal aortic aneurysm, without rupture: Secondary | ICD-10-CM | POA: Diagnosis not present

## 2015-07-25 DIAGNOSIS — E785 Hyperlipidemia, unspecified: Secondary | ICD-10-CM | POA: Diagnosis present

## 2015-07-25 DIAGNOSIS — Z8673 Personal history of transient ischemic attack (TIA), and cerebral infarction without residual deficits: Secondary | ICD-10-CM | POA: Diagnosis not present

## 2015-08-22 DIAGNOSIS — I701 Atherosclerosis of renal artery: Secondary | ICD-10-CM | POA: Diagnosis not present

## 2015-08-22 DIAGNOSIS — I714 Abdominal aortic aneurysm, without rupture: Secondary | ICD-10-CM | POA: Diagnosis not present

## 2015-08-22 DIAGNOSIS — Z9889 Other specified postprocedural states: Secondary | ICD-10-CM | POA: Diagnosis not present

## 2015-09-11 DIAGNOSIS — S92352A Displaced fracture of fifth metatarsal bone, left foot, initial encounter for closed fracture: Secondary | ICD-10-CM | POA: Diagnosis not present

## 2015-09-24 DIAGNOSIS — S92352D Displaced fracture of fifth metatarsal bone, left foot, subsequent encounter for fracture with routine healing: Secondary | ICD-10-CM | POA: Diagnosis not present

## 2015-10-14 DIAGNOSIS — M810 Age-related osteoporosis without current pathological fracture: Secondary | ICD-10-CM | POA: Diagnosis not present

## 2015-10-16 ENCOUNTER — Ambulatory Visit (INDEPENDENT_AMBULATORY_CARE_PROVIDER_SITE_OTHER): Payer: Medicare Other | Admitting: Internal Medicine

## 2015-10-16 ENCOUNTER — Telehealth: Payer: Self-pay

## 2015-10-16 ENCOUNTER — Encounter: Payer: Self-pay | Admitting: Internal Medicine

## 2015-10-16 VITALS — BP 118/76 | HR 76 | Resp 16 | Ht 62.5 in | Wt 135.0 lb

## 2015-10-16 DIAGNOSIS — I749 Embolism and thrombosis of unspecified artery: Secondary | ICD-10-CM

## 2015-10-16 DIAGNOSIS — M810 Age-related osteoporosis without current pathological fracture: Secondary | ICD-10-CM | POA: Diagnosis not present

## 2015-10-16 DIAGNOSIS — Z8679 Personal history of other diseases of the circulatory system: Secondary | ICD-10-CM | POA: Diagnosis not present

## 2015-10-16 DIAGNOSIS — D649 Anemia, unspecified: Secondary | ICD-10-CM | POA: Diagnosis not present

## 2015-10-16 DIAGNOSIS — G459 Transient cerebral ischemic attack, unspecified: Secondary | ICD-10-CM | POA: Diagnosis not present

## 2015-10-16 DIAGNOSIS — Z9889 Other specified postprocedural states: Secondary | ICD-10-CM | POA: Diagnosis not present

## 2015-10-16 DIAGNOSIS — I10 Essential (primary) hypertension: Secondary | ICD-10-CM

## 2015-10-16 DIAGNOSIS — I714 Abdominal aortic aneurysm, without rupture, unspecified: Secondary | ICD-10-CM

## 2015-10-16 MED ORDER — ALENDRONATE SODIUM 70 MG PO TABS: 70.0000 mg | ORAL_TABLET | ORAL | Status: DC

## 2015-10-16 NOTE — Progress Notes (Signed)
Date:  10/16/2015   Name:  Diana Padilla   DOB:  06-22-1934   MRN:  [id number]   Chief Complaint: Hypertension Hypertension This is a chronic problem. The current episode started more than 1 year ago. The problem is unchanged. The problem is controlled. Pertinent negatives include no chest pain, headaches, palpitations or shortness of breath.   Osteoporosis - DEXA done earlier this month.  She fractured her left 5th metatarsal last month and is wearing a boot. She currently takes calcium and vitamin D but is unsure of the dose.  AAA aneurysm repair - she has done well since surgery.  No abdominal pain other than occasional stinging discomfort in right groin.  Bowels are moving normally. Her follow up CBC was slightly low at [facility] - I did not see this until after the visit concluded and Diana Padilla did not mention it.  Review of Systems  Constitutional: Negative for fever, chills, fatigue and unexpected weight change.  HENT: Negative for ear pain, tinnitus and trouble swallowing.   Eyes: Negative for visual disturbance.  Respiratory: Negative for cough, chest tightness and shortness of breath.   Cardiovascular: Negative for chest pain, palpitations and leg swelling.  Gastrointestinal: Negative for diarrhea, constipation and blood in stool.  Genitourinary: Negative for dysuria.  Musculoskeletal: Positive for joint swelling and arthralgias.  Neurological: Negative for dizziness, syncope, numbness and headaches.  Psychiatric/Behavioral: Negative for sleep disturbance and dysphoric mood.    Patient Active Problem List   Diagnosis Date Noted  . Abdominal aortic aneurysm greater than 39 mm in diameter ([place]) 07/10/2015  . Benign essential hematuria 02/24/2015  . Dyslipidemia 02/24/2015  . Current tobacco use 02/24/2015  . Essential (primary) hypertension 02/24/2015  . Transient ischemic attack due to embolism ([place]) 02/24/2015  . Impaired renal function 02/24/2015    Prior to Admission  medications   Medication Sig Start Date End Date Taking? Authorizing Provider  Acetaminophen 500 MG coapsule Take by mouth.   Yes Historical Provider, MD  aspirin 81 MG chewable tablet Chew 1 tablet by mouth daily.   Yes Historical Provider, MD  Calcium Citrate-Vitamin D (CALCIUM + D PO) Take 1 tablet by mouth daily.   Yes Historical Provider, MD  Multiple Vitamins-Minerals (CENTRUM SILVER ULTRA WOMENS) TABS Take 1 tablet by mouth daily.   Yes Historical Provider, MD  omega-3 acid ethyl esters (LOVAZA) 1 g capsule Take by mouth 2 (two) times daily.   Yes Historical Provider, MD  valsartan (DIOVAN) 80 MG tablet Take 1 tablet (80 mg total) by mouth daily. 07/04/15  Yes Glean Hess, MD  vitamin E 1000 UNIT capsule Take 1 capsule by mouth daily.   Yes Historical Provider, MD    Allergies  Allergen Reactions  . Ace Inhibitors Cough  . Amlodipine Swelling  . Morphine Nausea And Vomiting    Past Surgical History  Procedure Laterality Date  . Total abdominal hysterectomy  1984    fibroids    Social History  Substance Use Topics  . Smoking status: Current Every Day Smoker -- 0.25 packs/day for 50 years    Types: Cigarettes  . Smokeless tobacco: None  . Alcohol Use: No     Medication list has been reviewed and updated.   Physical Exam  Constitutional: She is oriented to person, place, and time. She appears well-developed and well-nourished. No distress.  HENT:  Head: Normocephalic and atraumatic.  Neck: Normal range of motion. Neck supple.  Cardiovascular: Normal rate, regular rhythm and normal  heart sounds.   Pulmonary/Chest: Effort normal and breath sounds normal. No respiratory distress. She has no wheezes. She has no rales.  Abdominal: Soft. Bowel sounds are normal. There is no tenderness.  Musculoskeletal: She exhibits no edema (right ankle; left ankle in surgical boot).  Lymphadenopathy:    She has no cervical adenopathy.  Neurological: She is alert and oriented to  person, place, and time.  Skin: Skin is warm and dry. No rash noted.  Psychiatric: She has a normal mood and affect. Her behavior is normal. Thought content normal.  Nursing note and vitals reviewed.   BP 118/76 mmHg  Pulse 76  Resp 16  Ht 5' 2.5" (1.588 m)  Wt 135 lb (61.236 kg)  BMI 24.28 kg/m2  SpO2 98%  Assessment and Plan: 1. Osteoporosis Begin adequate calcium and vitamin D - alendronate (FOSAMAX) 70 MG tablet; Take 1 tablet (70 mg total) by mouth every 7 (seven) days. Take with a full glass of water on an empty stomach.  Dispense: 12 tablet; Refill: 3  2. Essential (primary) hypertension controlled  3. Abdominal aortic aneurysm greater than 39 mm in diameter (H[facility]) Repaired - continue follow up with Vascular surgery for routine monitoring  4. Anemia, unspecified anemia type Recommend iron supplement and follow up visit in one month WBC (White Blood Cell Count) 4.2 3.2 - 9.8 x10^9/L  Hemoglobin 9.7 (L) 12.0 - 15.5 g/dL  Hematocrit 29.9 (L) 35.0 - 45.0 %  Plt (platelets) 169 150 - 450 x10^9/L  MCV (Mean Corpuscular Volume) 90 80 - 98 fL  MCH (Mean Corpuscular Hemoglobin) 29.1 26.5 - 34.0 pg  MCHC (Mean Corpuscular Hemoglobin Concentration) 32.4 31.4 - 36.0 %  RBC (Red Blood Cell Count) 3.33 (L) 3.77 - 5.16 x10^12/L  RDW-CV (Red Cell Distribution Width) 13.6 11.5 - 14.5 %  NRBC (Nucleated Red Blood Cell Count) 0.00 0.00 - 0.00 x10^9/L  NRBC % (Nucleated Red Blood Cell %) 0.0 %  MPV (Mean Platelet Volume) 11.0 7.2 - 11.7 fL     Halina Maidens, MD [facility]  10/16/2015

## 2015-10-16 NOTE — Patient Instructions (Addendum)
1600 mg calcium daily 800 IU vitamin D daily   Alendronate tablets What is this medicine? ALENDRONATE (a LEN droe nate) slows calcium loss from bones. It helps to make normal healthy bone and to slow bone loss in people with Paget's disease and osteoporosis. It may be used in others at risk for bone loss. This medicine may be used for other purposes; ask your health care provider or pharmacist if you have questions. What should I tell my health care provider before I take this medicine? They need to know if you have any of these conditions: -dental disease -esophagus, stomach, or intestine problems, like acid reflux or GERD -kidney disease -low blood calcium -low vitamin D -problems sitting or standing 30 minutes -trouble swallowing -an unusual or allergic reaction to alendronate, other medicines, foods, dyes, or preservatives -pregnant or trying to get pregnant -breast-feeding How should I use this medicine? You must take this medicine exactly as directed or you will lower the amount of the medicine you absorb into your body or you may cause yourself harm. Take this medicine by mouth first thing in the morning, after you are up for the day. Do not eat or drink anything before you take your medicine. Swallow the tablet with a full glass (6 to 8 fluid ounces) of plain water. Do not take this medicine with any other drink. Do not chew or crush the tablet. After taking this medicine, do not eat breakfast, drink, or take any medicines or vitamins for at least 30 minutes. Sit or stand up for at least 30 minutes after you take this medicine; do not lie down. Do not take your medicine more often than directed. Talk to your pediatrician regarding the use of this medicine in children. Special care may be needed. Overdosage: If you think you have taken too much of this medicine contact a poison control center or emergency room at once. NOTE: This medicine is only for you. Do not share this medicine  with others. What if I miss a dose? If you miss a dose, do not take it later in the day. Continue your normal schedule starting the next morning. Do not take double or extra doses. What may interact with this medicine? -aluminum hydroxide -antacids -aspirin -calcium supplements -drugs for inflammation like ibuprofen, naproxen, and others -iron supplements -magnesium supplements -vitamins with minerals This list may not describe all possible interactions. Give your health care provider a list of all the medicines, herbs, non-prescription drugs, or dietary supplements you use. Also tell them if you smoke, drink alcohol, or use illegal drugs. Some items may interact with your medicine. What should I watch for while using this medicine? Visit your doctor or health care professional for regular checks ups. It may be some time before you see benefit from this medicine. Do not stop taking your medicine except on your doctor's advice. Your doctor or health care professional may order blood tests and other tests to see how you are doing. You should make sure you get enough calcium and vitamin D while you are taking this medicine, unless your doctor tells you not to. Discuss the foods you eat and the vitamins you take with your health care professional. Some people who take this medicine have severe bone, joint, and/or muscle pain. This medicine may also increase your risk for a broken thigh bone. Tell your doctor right away if you have pain in your upper leg or groin. Tell your doctor if you have any pain that does  not go away or that gets worse. This medicine can make you more sensitive to the sun. If you get a rash while taking this medicine, sunlight may cause the rash to get worse. Keep out of the sun. If you cannot avoid being in the sun, wear protective clothing and use sunscreen. Do not use sun lamps or tanning beds/booths. What side effects may I notice from receiving this medicine? Side effects  that you should report to your doctor or health care professional as soon as possible: -allergic reactions like skin rash, itching or hives, swelling of the face, lips, or tongue -black or tarry stools -bone, muscle or joint pain -changes in vision -chest pain -heartburn or stomach pain -jaw pain, especially after dental work -pain or trouble when swallowing -redness, blistering, peeling or loosening of the skin, including inside the mouth Side effects that usually do not require medical attention (report to your doctor or health care professional if they continue or are bothersome): -changes in taste -diarrhea or constipation -eye pain or itching -headache -nausea or vomiting -stomach gas or fullness This list may not describe all possible side effects. Call your doctor for medical advice about side effects. You may report side effects to FDA at 1-800-FDA-1088. Where should I keep my medicine? Keep out of the reach of children. Store at room temperature of 15 and 30 degrees C (59 and 86 degrees F). Throw away any unused medicine after the expiration date. NOTE: This sheet is a summary. It may not cover all possible information. If you have questions about this medicine, talk to your doctor, pharmacist, or health care provider.    2016, Elsevier/Gold Standard. (2010-10-24 08:56:09)

## 2015-11-01 NOTE — Telephone Encounter (Signed)
ERROR

## 2015-11-05 DIAGNOSIS — M810 Age-related osteoporosis without current pathological fracture: Secondary | ICD-10-CM | POA: Diagnosis not present

## 2015-11-05 DIAGNOSIS — S92352D Displaced fracture of fifth metatarsal bone, left foot, subsequent encounter for fracture with routine healing: Secondary | ICD-10-CM | POA: Diagnosis not present

## 2015-11-18 ENCOUNTER — Ambulatory Visit (INDEPENDENT_AMBULATORY_CARE_PROVIDER_SITE_OTHER): Payer: Medicare Other | Admitting: Internal Medicine

## 2015-11-18 ENCOUNTER — Encounter: Payer: Self-pay | Admitting: Internal Medicine

## 2015-11-18 VITALS — BP 127/78 | HR 67 | Resp 16 | Wt 143.0 lb

## 2015-11-18 DIAGNOSIS — M810 Age-related osteoporosis without current pathological fracture: Secondary | ICD-10-CM

## 2015-11-18 DIAGNOSIS — G459 Transient cerebral ischemic attack, unspecified: Secondary | ICD-10-CM

## 2015-11-18 DIAGNOSIS — I749 Embolism and thrombosis of unspecified artery: Secondary | ICD-10-CM | POA: Diagnosis not present

## 2015-11-18 DIAGNOSIS — Z720 Tobacco use: Secondary | ICD-10-CM

## 2015-11-18 DIAGNOSIS — D649 Anemia, unspecified: Secondary | ICD-10-CM

## 2015-11-18 DIAGNOSIS — I10 Essential (primary) hypertension: Secondary | ICD-10-CM

## 2015-11-18 DIAGNOSIS — F17200 Nicotine dependence, unspecified, uncomplicated: Secondary | ICD-10-CM

## 2015-11-18 NOTE — Patient Instructions (Signed)
Smoking Cessation, Tips for Success If you are ready to quit smoking, congratulations! You have chosen to help yourself be healthier. Cigarettes bring nicotine, tar, carbon monoxide, and other irritants into your body. Your lungs, heart, and blood vessels will be able to work better without these poisons. There are many different ways to quit smoking. Nicotine gum, nicotine patches, a nicotine inhaler, or nicotine nasal spray can help with physical craving. Hypnosis, support groups, and medicines help break the habit of smoking. WHAT THINGS CAN I DO TO MAKE QUITTING EASIER?  Here are some tips to help you quit for good:  Pick a date when you will quit smoking completely. Tell all of your friends and family about your plan to quit on that date.  Do not try to slowly cut down on the number of cigarettes you are smoking. Pick a quit date and quit smoking completely starting on that day.  Throw away all cigarettes.   Clean and remove all ashtrays from your home, work, and car.  On a card, write down your reasons for quitting. Carry the card with you and read it when you get the urge to smoke.  Cleanse your body of nicotine. Drink enough water and fluids to keep your urine clear or pale yellow. Do this after quitting to flush the nicotine from your body.  Learn to predict your moods. Do not let a bad situation be your excuse to have a cigarette. Some situations in your life might tempt you into wanting a cigarette.  Never have "just one" cigarette. It leads to wanting another and another. Remind yourself of your decision to quit.  Change habits associated with smoking. If you smoked while driving or when feeling stressed, try other activities to replace smoking. Stand up when drinking your coffee. Brush your teeth after eating. Sit in a different chair when you read the paper. Avoid alcohol while trying to quit, and try to drink fewer caffeinated beverages. Alcohol and caffeine may urge you to  smoke.  Avoid foods and drinks that can trigger a desire to smoke, such as sugary or spicy foods and alcohol.  Ask people who smoke not to smoke around you.  Have something planned to do right after eating or having a cup of coffee. For example, plan to take a walk or exercise.  Try a relaxation exercise to calm you down and decrease your stress. Remember, you may be tense and nervous for the first 2 weeks after you quit, but this will pass.  Find new activities to keep your hands busy. Play with a pen, coin, or rubber band. Doodle or draw things on paper.  Brush your teeth right after eating. This will help cut down on the craving for the taste of tobacco after meals. You can also try mouthwash.   Use oral substitutes in place of cigarettes. Try using lemon drops, carrots, cinnamon sticks, or chewing gum. Keep them handy so they are available when you have the urge to smoke.  When you have the urge to smoke, try deep breathing.  Designate your home as a nonsmoking area.  If you are a heavy smoker, ask your health care provider about a prescription for nicotine chewing gum. It can ease your withdrawal from nicotine.  Reward yourself. Set aside the cigarette money you save and buy yourself something nice.  Look for support from others. Join a support group or smoking cessation program. Ask someone at home or at work to help you with your plan   to quit smoking.  Always ask yourself, "Do I need this cigarette or is this just a reflex?" Tell yourself, "Today, I choose not to smoke," or "I do not want to smoke." You are reminding yourself of your decision to quit.  Do not replace cigarette smoking with electronic cigarettes (commonly called e-cigarettes). The safety of e-cigarettes is unknown, and some may contain harmful chemicals.  If you relapse, do not give up! Plan ahead and think about what you will do the next time you get the urge to smoke. HOW WILL I FEEL WHEN I QUIT SMOKING? You  may have symptoms of withdrawal because your body is used to nicotine (the addictive substance in cigarettes). You may crave cigarettes, be irritable, feel very hungry, cough often, get headaches, or have difficulty concentrating. The withdrawal symptoms are only temporary. They are strongest when you first quit but will go away within 10-14 days. When withdrawal symptoms occur, stay in control. Think about your reasons for quitting. Remind yourself that these are signs that your body is healing and getting used to being without cigarettes. Remember that withdrawal symptoms are easier to treat than the major diseases that smoking can cause.  Even after the withdrawal is over, expect periodic urges to smoke. However, these cravings are generally short lived and will go away whether you smoke or not. Do not smoke! WHAT RESOURCES ARE AVAILABLE TO HELP ME QUIT SMOKING? Your health care provider can direct you to community resources or hospitals for support, which may include:  Group support.  Education.  Hypnosis.  Therapy.   This information is not intended to replace advice given to you by your health care provider. Make sure you discuss any questions you have with your health care provider.   Document Released: 01/24/2004 Document Revised: 05/18/2014 Document Reviewed: 10/13/2012 Elsevier Interactive Patient Education 2016 Elsevier Inc.  

## 2015-11-18 NOTE — Progress Notes (Signed)
Date:  11/18/2015   Name:  Diana Padilla   DOB:  1934-09-17   MRN:  [id number]   Chief Complaint: Hypertension Hypertension This is a chronic problem. The current episode started more than 1 year ago. The problem is unchanged. The problem is controlled. Pertinent negatives include no chest pain, palpitations or shortness of breath.   Post op anemia - started on iron last visit. She is tolerating it well with no constipation.  Energy level is good.  No chest pain or shortness of breath.  Osteoporosis and recent foot fracture - healed.  Now out of cast and having no pain.  Started Fosamax last week.  No complaints or questions.  She continues on calcium and vitamin D.  Review of Systems  Constitutional: Negative for fever, chills and fatigue.  Respiratory: Negative for cough, chest tightness and shortness of breath.   Cardiovascular: Negative for chest pain, palpitations and leg swelling.  Gastrointestinal: Negative for abdominal pain, constipation and blood in stool.  Musculoskeletal: Negative for arthralgias and gait problem.  Hematological: Negative for adenopathy.  Psychiatric/Behavioral: Negative for dysphoric mood.    Patient Active Problem List   Diagnosis Date Noted  . Osteoporosis 10/16/2015  . Absolute anemia 10/16/2015  . Status post percutaneous abdominal aortic aneurysm (AAA) repair 10/16/2015  . Abdominal aortic aneurysm greater than 39 mm in diameter ([place]) 07/10/2015  . Benign essential hematuria 02/24/2015  . Dyslipidemia 02/24/2015  . Current tobacco use 02/24/2015  . Essential (primary) hypertension 02/24/2015  . Transient ischemic attack due to embolism ([place]) 02/24/2015  . Impaired renal function 02/24/2015  . HLD (hyperlipidemia) 02/24/2015  . Tobacco use 02/24/2015    Prior to Admission medications   Medication Sig Start Date End Date Taking? Authorizing Provider  Acetaminophen 500 MG coapsule Take by mouth.   Yes Historical Provider, MD  alendronate  (FOSAMAX) 70 MG tablet Take 1 tablet (70 mg total) by mouth every 7 (seven) days. Take with a full glass of water on an empty stomach. 10/16/15  Yes Glean Hess, MD  aspirin 81 MG chewable tablet Chew 1 tablet by mouth daily.   Yes Historical Provider, MD  Calcium Citrate-Vitamin D (CALCIUM + D PO) Take 1 tablet by mouth daily.   Yes Historical Provider, MD  Multiple Vitamins-Minerals (CENTRUM SILVER ULTRA WOMENS) TABS Take 1 tablet by mouth daily.   Yes Historical Provider, MD  omega-3 acid ethyl esters (LOVAZA) 1 g capsule Take by mouth 2 (two) times daily.   Yes Historical Provider, MD  valsartan (DIOVAN) 80 MG tablet Take 1 tablet (80 mg total) by mouth daily. 07/04/15  Yes Glean Hess, MD  vitamin E 1000 UNIT capsule Take 1 capsule by mouth daily.   Yes Historical Provider, MD    Allergies  Allergen Reactions  . Ace Inhibitors Cough  . Amlodipine Swelling  . Morphine Nausea And Vomiting    Past Surgical History  Procedure Laterality Date  . Total abdominal hysterectomy  1984    fibroids  . Abdominal aortic aneurysm repair  07/2015    Social History  Substance Use Topics  . Smoking status: Current Every Day Smoker -- 0.25 packs/day for 50 years    Types: Cigarettes  . Smokeless tobacco: None  . Alcohol Use: No     Medication list has been reviewed and updated.   Physical Exam  Constitutional: She is oriented to person, place, and time. She appears well-developed. No distress.  HENT:  Head: Normocephalic and atraumatic.  Neck: Normal range of motion. Neck supple.  Cardiovascular: Normal rate, regular rhythm and normal heart sounds.   Pulmonary/Chest: Effort normal and breath sounds normal. No respiratory distress. She has no wheezes.  Abdominal: Soft. Bowel sounds are normal. There is no tenderness.  Musculoskeletal: She exhibits no edema or tenderness.  Neurological: She is alert and oriented to person, place, and time.  Skin: Skin is warm and dry. No rash  noted.  Psychiatric: She has a normal mood and affect. Her behavior is normal. Thought content normal.  Nursing note and vitals reviewed.   BP 127/78 mmHg  Pulse 67  Resp 16  Wt 143 lb (64.864 kg)  SpO2 97%  Assessment and Plan: 1. Smoker Encouraged to quit completely - Nurse to provide smoking / tobacco cessation education  2. Essential (primary) hypertension controlled  3. Osteoporosis Now on Fosamax plus Calcium + D  4. Tobacco use As above  5. Anemia, unspecified anemia type Continue iron - will advise if she can discontinue - CBC with Differential/Platelet   Halina Maidens, MD [facility]  11/18/2015

## 2015-11-19 LAB — CBC WITH DIFFERENTIAL/PLATELET
BASOS ABS: 0 10*3/uL (ref 0.0–0.2)
Basos: 0 %
EOS (ABSOLUTE): 0.3 10*3/uL (ref 0.0–0.4)
Eos: 3 %
HEMOGLOBIN: 12.7 g/dL (ref 11.1–15.9)
Hematocrit: 39.9 % (ref 34.0–46.6)
IMMATURE GRANS (ABS): 0 10*3/uL (ref 0.0–0.1)
IMMATURE GRANULOCYTES: 0 %
LYMPHS: 29 %
Lymphocytes Absolute: 2.6 10*3/uL (ref 0.7–3.1)
MCH: 27.9 pg (ref 26.6–33.0)
MCHC: 31.8 g/dL (ref 31.5–35.7)
MCV: 88 fL (ref 79–97)
MONOCYTES: 6 %
Monocytes Absolute: 0.5 10*3/uL (ref 0.1–0.9)
NEUTROS ABS: 5.6 10*3/uL (ref 1.4–7.0)
NEUTROS PCT: 62 %
PLATELETS: 266 10*3/uL (ref 150–379)
RBC: 4.56 x10E6/uL (ref 3.77–5.28)
RDW: 14.9 % (ref 12.3–15.4)
WBC: 9.1 10*3/uL (ref 3.4–10.8)

## 2015-11-21 DIAGNOSIS — Z8679 Personal history of other diseases of the circulatory system: Secondary | ICD-10-CM | POA: Diagnosis not present

## 2015-11-21 DIAGNOSIS — Z95828 Presence of other vascular implants and grafts: Secondary | ICD-10-CM | POA: Diagnosis not present

## 2015-11-21 DIAGNOSIS — Z9889 Other specified postprocedural states: Secondary | ICD-10-CM | POA: Diagnosis not present

## 2016-02-21 DIAGNOSIS — Z23 Encounter for immunization: Secondary | ICD-10-CM | POA: Diagnosis not present

## 2016-04-16 ENCOUNTER — Encounter: Payer: Self-pay | Admitting: Internal Medicine

## 2016-04-16 ENCOUNTER — Ambulatory Visit (INDEPENDENT_AMBULATORY_CARE_PROVIDER_SITE_OTHER): Payer: Medicare Other | Admitting: Internal Medicine

## 2016-04-16 VITALS — BP 110/73 | HR 68 | Resp 16 | Ht 62.5 in | Wt 138.0 lb

## 2016-04-16 DIAGNOSIS — Z0000 Encounter for general adult medical examination without abnormal findings: Secondary | ICD-10-CM

## 2016-04-16 DIAGNOSIS — Z1231 Encounter for screening mammogram for malignant neoplasm of breast: Secondary | ICD-10-CM

## 2016-04-16 DIAGNOSIS — I10 Essential (primary) hypertension: Secondary | ICD-10-CM | POA: Diagnosis not present

## 2016-04-16 DIAGNOSIS — I749 Embolism and thrombosis of unspecified artery: Secondary | ICD-10-CM | POA: Diagnosis not present

## 2016-04-16 DIAGNOSIS — M810 Age-related osteoporosis without current pathological fracture: Secondary | ICD-10-CM | POA: Diagnosis not present

## 2016-04-16 DIAGNOSIS — G459 Transient cerebral ischemic attack, unspecified: Secondary | ICD-10-CM | POA: Diagnosis not present

## 2016-04-16 DIAGNOSIS — N289 Disorder of kidney and ureter, unspecified: Secondary | ICD-10-CM | POA: Diagnosis not present

## 2016-04-16 DIAGNOSIS — E782 Mixed hyperlipidemia: Secondary | ICD-10-CM

## 2016-04-16 LAB — POCT URINALYSIS DIPSTICK
GLUCOSE UA: NEGATIVE
Ketones, UA: NEGATIVE
LEUKOCYTES UA: NEGATIVE
NITRITE UA: NEGATIVE
Protein, UA: NEGATIVE
Spec Grav, UA: 1.01
UROBILINOGEN UA: 0.2
pH, UA: 6

## 2016-04-16 NOTE — Patient Instructions (Addendum)
Health Maintenance  Topic Date Due  . TETANUS/TDAP  11[birth year]29/1953  . MAMMOGRAM  05/07/2016  . INFLUENZA VACCINE  Completed  . DEXA SCAN  Completed  . ZOSTAVAX  Completed  . PNA vac Low Risk Adult  Completed    Breast Self-Awareness Introduction Breast self-awareness means being familiar with how your breasts look and feel. It involves checking your breasts regularly and reporting any changes to your health care provider. Practicing breast self-awareness is important. A change in your breasts can be a sign of a serious medical problem. Being familiar with how your breasts look and feel allows you to find any problems early, when treatment is more likely to be successful. All women should practice breast self-awareness, including women who have had breast implants. How to do a breast self-exam One way to learn what is normal for your breasts and whether your breasts are changing is to do a breast self-exam. To do a breast self-exam: Look for Changes  1. Remove all the clothing above your waist. 2. Stand in front of a mirror in a room with good lighting. 3. Put your hands on your hips. 4. Push your hands firmly downward. 5. Compare your breasts in the mirror. Look for differences between them (asymmetry), such as:  Differences in shape.  Differences in size.  Puckers, dips, and bumps in one breast and not the other. 6. Look at each breast for changes in your skin, such as:  Redness.  Scaly areas. 7. Look for changes in your nipples, such as:  Discharge.  Bleeding.  Dimpling.  Redness.  A change in position. Feel for Changes  Carefully feel your breasts for lumps and changes. It is best to do this while lying on your back on the floor and again while sitting or standing in the shower or tub with soapy water on your skin. Feel each breast in the following way:  Place the arm on the side of the breast you are examining above your head.  Feel your breast with the other  hand.  Start in the nipple area and make  inch (2 cm) overlapping circles to feel your breast. Use the pads of your three middle fingers to do this. Apply light pressure, then medium pressure, then firm pressure. The light pressure will allow you to feel the tissue closest to the skin. The medium pressure will allow you to feel the tissue that is a little deeper. The firm pressure will allow you to feel the tissue close to the ribs.  Continue the overlapping circles, moving downward over the breast until you feel your ribs below your breast.  Move one finger-width toward the center of the body. Continue to use the  inch (2 cm) overlapping circles to feel your breast as you move slowly up toward your collarbone.  Continue the up and down exam using all three pressures until you reach your armpit. Write Down What You Find  Write down what is normal for each breast and any changes that you find. Keep a written record with breast changes or normal findings for each breast. By writing this information down, you do not need to depend only on memory for size, tenderness, or location. Write down where you are in your menstrual cycle, if you are still menstruating. If you are having trouble noticing differences in your breasts, do not get discouraged. With time you will become more familiar with the variations in your breasts and more comfortable with the exam. How often should  I examine my breasts? Examine your breasts every month. If you are breastfeeding, the best time to examine your breasts is after a feeding or after using a breast pump. If you menstruate, the best time to examine your breasts is 5-7 days after your period is over. During your period, your breasts are lumpier, and it may be more difficult to notice changes. When should I see my health care provider? See your health care provider if you notice:  A change in shape or size of your breasts or nipples.  A change in the skin of your  breast or nipples, such as a reddened or scaly area.  Unusual discharge from your nipples.  A lump or thick area that was not there before.  Pain in your breasts.  Anything that concerns you. This information is not intended to replace advice given to you by your health care provider. Make sure you discuss any questions you have with your health care provider. Document Released: 04/27/2005 Document Revised: 10/03/2015 Document Reviewed: 03/17/2015  2017 [employer]vier

## 2016-04-16 NOTE — Progress Notes (Signed)
Patient: Diana Padilla, Female    DOB: 1934-07-16, 81 y.o.   MRN: [id number] Visit Date: 04/16/2016  Today's Provider: Halina Maidens, MD   Chief Complaint  Patient presents with  . Medicare Wellness   Subjective:    Annual wellness visit Diana Padilla is a 81 y.o. female who presents today for her Subsequent Annual Wellness Visit. She feels well. She reports exercising walking. She reports she is sleeping well.   ----------------------------------------------------------- Hypertension  This is a chronic problem. The problem is unchanged. The problem is controlled. Pertinent negatives include no chest pain, headaches, palpitations or shortness of breath.  Osteoporosis - on Fosamax and calcium.  Last DEXA Done 10/2015.  Taking medication without problems or side effects.  Review of Systems  Constitutional: Negative for chills, fatigue and fever.  HENT: Negative for congestion, hearing loss, tinnitus, trouble swallowing and voice change.   Eyes: Negative for visual disturbance.  Respiratory: Negative for cough, chest tightness, shortness of breath and wheezing.   Cardiovascular: Negative for chest pain, palpitations and leg swelling.  Gastrointestinal: Positive for abdominal distention (and excessive gas). Negative for abdominal pain, constipation, diarrhea and vomiting.  Endocrine: Negative for polydipsia and polyuria.  Genitourinary: Negative for dysuria, frequency, genital sores, vaginal bleeding and vaginal discharge.  Musculoskeletal: Negative for arthralgias, gait problem and joint swelling.  Skin: Negative for color change and rash.  Neurological: Negative for dizziness, tremors, light-headedness and headaches.  Hematological: Negative for adenopathy. Does not bruise/bleed easily.  Psychiatric/Behavioral: Negative for dysphoric mood and sleep disturbance. The patient is not nervous/anxious.     Social History   Social History  . Marital status: Married    Spouse name:  N/A  . Number of children: N/A  . Years of education: N/A   Occupational History  . Not on file.   Social History Main Topics  . Smoking status: Former Smoker    Packs/day: 0.25    Years: 50.00    Types: Cigarettes    Quit date: 07/10/2015  . Smokeless tobacco: Never Used  . Alcohol use No  . Drug use: No  . Sexual activity: Not on file   Other Topics Concern  . Not on file   Social History Narrative  . No narrative on file    Patient Active Problem List   Diagnosis Date Noted  . Osteoporosis 10/16/2015  . Absolute anemia 10/16/2015  . Status post percutaneous abdominal aortic aneurysm (AAA) repair 10/16/2015  . Abdominal aortic aneurysm greater than 39 mm in diameter ([place]) 07/10/2015  . Benign essential hematuria 02/24/2015  . Dyslipidemia 02/24/2015  . Current tobacco use 02/24/2015  . Essential (primary) hypertension 02/24/2015  . Transient ischemic attack due to embolism ([place]) 02/24/2015  . Impaired renal function 02/24/2015  . Tobacco use 02/24/2015    Past Surgical History:  Procedure Laterality Date  . ABDOMINAL AORTIC ANEURYSM REPAIR  07/2015  . TOTAL ABDOMINAL HYSTERECTOMY  1984   fibroids    Her family history includes Parkinson's disease in her father.     Previous Medications   ACETAMINOPHEN 500 MG COAPSULE    Take by mouth.   ALENDRONATE (FOSAMAX) 70 MG TABLET    Take by mouth.   ASPIRIN 81 MG CHEWABLE TABLET    Chew by mouth.   CALCIUM CITRATE-VITAMIN D (CALCIUM + D PO)    Take 1 tablet by mouth daily.   MULTIPLE VITAMINS-MINERALS (CENTRUM SILVER ULTRA WOMENS) TABS    Take 1 tablet by mouth daily.   OMEGA-3  ACID ETHYL ESTERS (LOVAZA) 1 G CAPSULE    Take by mouth 2 (two) times daily.   VALSARTAN (DIOVAN) 80 MG TABLET    Take 1 tablet (80 mg total) by mouth daily.   VITAMIN E 1000 UNIT CAPSULE    Take 1 capsule by mouth daily.   Wt Readings from Last 3 Encounters:  04/16/16 138 lb (62.6 kg)  11/18/15 143 lb (64.9 kg)  10/16/15 135 lb (61.2 kg)    BP Readings from Last 3 Encounters:  04/16/16 110/73  11/18/15 127/78  10/16/15 118/76    Patient Care Team: Glean Hess, MD as PCP - General (Internal Medicine)      Objective:   Vitals: BP 110/73   Pulse 68   Resp 16   Ht 5' 2.5" (1.588 m)   Wt 138 lb (62.6 kg)   SpO2 98%   BMI 24.84 kg/m   Physical Exam  Constitutional: She is oriented to person, place, and time. She appears well-developed and well-nourished. No distress.  HENT:  Head: Normocephalic and atraumatic.  Right Ear: Tympanic membrane and ear canal normal.  Left Ear: Tympanic membrane and ear canal normal.  Nose: Right sinus exhibits no maxillary sinus tenderness. Left sinus exhibits no maxillary sinus tenderness.  Mouth/Throat: Uvula is midline and oropharynx is clear and moist.  Eyes: Conjunctivae and EOM are normal. Right eye exhibits no discharge. Left eye exhibits no discharge. No scleral icterus.  Neck: Normal range of motion. Carotid bruit is not present. No erythema present. No thyromegaly present.  Cardiovascular: Normal rate, regular rhythm, normal heart sounds and normal pulses.   Pulmonary/Chest: Effort normal. No respiratory distress. She has no wheezes. Right breast exhibits no mass, no nipple discharge, no skin change and no tenderness. Left breast exhibits no mass, no nipple discharge, no skin change and no tenderness.  Abdominal: Soft. Bowel sounds are normal. There is no hepatosplenomegaly. There is no tenderness. There is no CVA tenderness.  Musculoskeletal: Normal range of motion.  Lymphadenopathy:    She has no cervical adenopathy.    She has no axillary adenopathy.  Neurological: She is alert and oriented to person, place, and time. She has normal reflexes. No cranial nerve deficit or sensory deficit.  Skin: Skin is warm, dry and intact. No rash noted.  Psychiatric: She has a normal mood and affect. Her speech is normal and behavior is normal. Thought content normal.  Nursing note  and vitals reviewed.   Activities of Daily Living In your present state of health, do you have any difficulty performing the following activities: 04/16/2016 10/16/2015  Hearing? N N  Vision? N N  Difficulty concentrating or making decisions? N N  Walking or climbing stairs? N N  Dressing or bathing? N N  Doing errands, shopping? N N  Preparing Food and eating ? N -  Using the Toilet? N -  In the past six months, have you accidently leaked urine? N -  Do you have problems with loss of bowel control? N -  Managing your Medications? N -  Managing your Finances? N -  Housekeeping or managing your Housekeeping? N -  Some recent data might be hidden    Fall Risk Assessment Fall Risk  04/16/2016 10/16/2015 04/16/2015  Falls in the past year? No Yes No  Number falls in past yr: - 1 -  Injury with Fall? - No -      Depression Screen PHQ 2/9 Scores 04/16/2016 10/16/2015 04/16/2015  PHQ - 2 Score  0 0 0   6CIT Screen 04/16/2016  What Year? 0 points  What month? 0 points  What time? 0 points  Count back from 20 0 points  Months in reverse 0 points  Repeat phrase 0 points  Total Score 0      Medicare Annual Wellness Visit Summary:  Reviewed patient's Family Medical History Reviewed and updated list of patient's medical providers Assessment of cognitive impairment was done Assessed patient's functional ability Established a written schedule for health screening [place] Completed and Reviewed  Exercise Activities and Dietary recommendations Goals    . Quit smoking / using tobacco       Immunization History  Administered Date(s) Administered  . Influenza-Unspecified 02/15/2015  . Pneumococcal Conjugate-13 02/23/2014  . Pneumococcal Polysaccharide-23 03/11/1998, 05/12/2002  . Zoster 05/12/1998    Health Maintenance  Topic Date Due  . Samul Dada  11/29/1953  . MAMMOGRAM  05/07/2016  . INFLUENZA VACCINE  Completed  . DEXA SCAN  Completed  . ZOSTAVAX   Completed  . PNA vac Low Risk Adult  Completed    Discussed health benefits of physical activity, and encouraged her to engage in regular exercise appropriate for her age and condition.    ------------------------------------------------------------------------------------------------------------  Assessment & Plan:  1. Medicare annual wellness visit, subsequent Measures satisfied Resume regular walking routine - POCT urinalysis dipstick - negative other than persistent microscopic hematuria  2. Essential (primary) hypertension controlled - CBC with Differential/Platelet - TSH  3. Transient ischemic attack due to embolism (HCC) Resolved.  Continue aspirin  4. Impaired renal function Continue to monitor - Comprehensive metabolic panel  5. Mixed hyperlipidemia stable - Lipid panel  6. Encounter for screening mammogram for breast cancer - MM DIGITAL SCREENING BILATERAL  7. Age-related osteoporosis without current pathological fracture Tolerating Fosamax Repeat DEXA in 2-3 years  Halina Maidens, MD [facility]  04/16/2016

## 2016-04-17 LAB — CBC WITH DIFFERENTIAL/PLATELET
BASOS: 0 %
Basophils Absolute: 0 10*3/uL (ref 0.0–0.2)
EOS (ABSOLUTE): 0.2 10*3/uL (ref 0.0–0.4)
EOS: 2 %
HEMATOCRIT: 38.3 % (ref 34.0–46.6)
Hemoglobin: 13.2 g/dL (ref 11.1–15.9)
IMMATURE GRANS (ABS): 0 10*3/uL (ref 0.0–0.1)
IMMATURE GRANULOCYTES: 0 %
LYMPHS: 30 %
Lymphocytes Absolute: 2.7 10*3/uL (ref 0.7–3.1)
MCH: 29.8 pg (ref 26.6–33.0)
MCHC: 34.5 g/dL (ref 31.5–35.7)
MCV: 87 fL (ref 79–97)
MONOS ABS: 0.5 10*3/uL (ref 0.1–0.9)
Monocytes: 6 %
NEUTROS PCT: 62 %
Neutrophils Absolute: 5.5 10*3/uL (ref 1.4–7.0)
PLATELETS: 275 10*3/uL (ref 150–379)
RBC: 4.43 x10E6/uL (ref 3.77–5.28)
RDW: 14 % (ref 12.3–15.4)
WBC: 9.1 10*3/uL (ref 3.4–10.8)

## 2016-04-17 LAB — COMPREHENSIVE METABOLIC PANEL
ALT: 16 IU/L (ref 0–32)
AST: 19 IU/L (ref 0–40)
Albumin/Globulin Ratio: 1.3 (ref 1.2–2.2)
Albumin: 4.4 g/dL (ref 3.5–4.8)
Alkaline Phosphatase: 60 IU/L (ref 39–117)
BUN/Creatinine Ratio: 15 (ref 12–28)
BUN: 16 mg/dL (ref 8–27)
Bilirubin Total: 0.6 mg/dL (ref 0.0–1.2)
CALCIUM: 10.2 mg/dL (ref 8.7–10.3)
CO2: 26 mmol/L (ref 18–29)
CREATININE: 1.05 mg/dL — AB (ref 0.57–1.00)
Chloride: 94 mmol/L — ABNORMAL LOW (ref 96–106)
GFR, EST AFRICAN AMERICAN: 58 mL/min/{1.73_m2} — AB (ref >59)
GFR, EST NON AFRICAN AMERICAN: 51 mL/min/{1.73_m2} — AB (ref >59)
Globulin, Total: 3.3 g/dL (ref 1.5–4.5)
Glucose: 95 mg/dL (ref 65–99)
Potassium: 4.3 mmol/L (ref 3.5–5.2)
Sodium: 139 mmol/L (ref 134–144)
TOTAL PROTEIN: 7.7 g/dL (ref 6.0–8.5)

## 2016-04-17 LAB — LIPID PANEL
CHOL/HDL RATIO: 3.2 ratio (ref 0.0–4.4)
Cholesterol, Total: 231 mg/dL — ABNORMAL HIGH (ref 100–199)
HDL: 72 mg/dL (ref >39)
LDL CALC: 135 mg/dL — AB (ref 0–99)
TRIGLYCERIDES: 118 mg/dL (ref 0–149)
VLDL CHOLESTEROL CAL: 24 mg/dL (ref 5–40)

## 2016-04-17 LAB — TSH: TSH: 1.93 u[IU]/mL (ref 0.450–4.500)

## 2016-05-13 ENCOUNTER — Other Ambulatory Visit: Payer: Self-pay | Admitting: Internal Medicine

## 2016-05-21 DIAGNOSIS — Z8679 Personal history of other diseases of the circulatory system: Secondary | ICD-10-CM | POA: Diagnosis not present

## 2016-05-21 DIAGNOSIS — Z95828 Presence of other vascular implants and grafts: Secondary | ICD-10-CM | POA: Diagnosis not present

## 2016-05-26 DIAGNOSIS — Z1231 Encounter for screening mammogram for malignant neoplasm of breast: Secondary | ICD-10-CM | POA: Diagnosis not present

## 2016-06-12 ENCOUNTER — Ambulatory Visit (INDEPENDENT_AMBULATORY_CARE_PROVIDER_SITE_OTHER): Payer: Medicare Other | Admitting: Internal Medicine

## 2016-06-12 ENCOUNTER — Encounter: Payer: Self-pay | Admitting: Internal Medicine

## 2016-06-12 VITALS — BP 110/64 | HR 70 | Temp 97.6°F | Ht 62.5 in | Wt 142.0 lb

## 2016-06-12 DIAGNOSIS — I10 Essential (primary) hypertension: Secondary | ICD-10-CM | POA: Diagnosis not present

## 2016-06-12 NOTE — Progress Notes (Signed)
Date:  06/12/2016   Name:  Diana Padilla   DOB:  1934/07/28   MRN:  [id number]   Chief Complaint: Hypertension Hypertension  This is a chronic problem. The current episode started more than 1 year ago. The problem has been gradually improving since onset. The problem is controlled. Pertinent negatives include no chest pain, headaches, palpitations or shortness of breath. The current treatment provides significant improvement. There are no compliance problems.   BP has been low [id number] systolic over the past 2 weeks.  She has been cutting the pills in half and taking one or one half every other day.  Day after the whole pills pressure is low, day after 1/2 pill pressure is normal.    Review of Systems  Constitutional: Negative for chills, fatigue and fever.  Respiratory: Negative for chest tightness and shortness of breath.   Cardiovascular: Negative for chest pain and palpitations.  Gastrointestinal: Negative for abdominal pain.  Neurological: Negative for dizziness, tremors, weakness, light-headedness and headaches.  Hematological: Negative for adenopathy.    Patient Active Problem List   Diagnosis Date Noted  . Age-related osteoporosis without current pathological fracture 04/16/2016  . Absolute anemia 10/16/2015  . Status post percutaneous abdominal aortic aneurysm (AAA) repair 10/16/2015  . Abdominal aortic aneurysm greater than 39 mm in diameter ([place]) 07/10/2015  . Benign essential hematuria 02/24/2015  . Dyslipidemia 02/24/2015  . Current tobacco use 02/24/2015  . Essential (primary) hypertension 02/24/2015  . Transient ischemic attack due to embolism ([place]) 02/24/2015  . Impaired renal function 02/24/2015  . Tobacco use 02/24/2015    Prior to Admission medications   Medication Sig Start Date End Date Taking? Authorizing Provider  Acetaminophen 500 MG coapsule Take by mouth.   Yes Historical Provider, MD  alendronate (FOSAMAX) 70 MG tablet Take by mouth.   Yes Historical  Provider, MD  aspirin 81 MG chewable tablet Chew by mouth.   Yes Historical Provider, MD  Calcium Citrate-Vitamin D (CALCIUM + D PO) Take 1 tablet by mouth daily.   Yes Historical Provider, MD  Multiple Vitamins-Minerals (CENTRUM SILVER ULTRA WOMENS) TABS Take 1 tablet by mouth daily.   Yes Historical Provider, MD  omega-3 acid ethyl esters (LOVAZA) 1 g capsule Take by mouth 2 (two) times daily.   Yes Historical Provider, MD  valsartan-hydrochlorothiazide (DIOVAN-HCT) 80-12.5 MG tablet TAKE 1 TABLET EVERY DAY 05/13/16  Yes Glean Hess, MD  vitamin E 1000 UNIT capsule Take 1 capsule by mouth daily.   Yes Historical Provider, MD  valsartan (DIOVAN) 80 MG tablet Take 1 tablet (80 mg total) by mouth daily. Patient not taking: Reported on 06/12/2016 07/04/15   Glean Hess, MD    Allergies  Allergen Reactions  . Ace Inhibitors Cough  . Amlodipine Swelling  . Morphine Nausea And Vomiting    Past Surgical History:  Procedure Laterality Date  . ABDOMINAL AORTIC ANEURYSM REPAIR  07/2015  . TOTAL ABDOMINAL HYSTERECTOMY  1984   fibroids    Social History  Substance Use Topics  . Smoking status: Former Smoker    Packs/day: 0.25    Years: 50.00    Types: Cigarettes    Quit date: 07/10/2015  . Smokeless tobacco: Never Used  . Alcohol use No     Medication list has been reviewed and updated.   Physical Exam  Constitutional: She is oriented to person, place, and time. She appears well-developed. No distress.  HENT:  Head: Normocephalic and atraumatic.  Neck: Normal range of  motion. Neck supple.  Cardiovascular: Normal rate, regular rhythm and normal heart sounds.   Pulmonary/Chest: Effort normal and breath sounds normal. No respiratory distress. She has no wheezes.  Musculoskeletal: Normal range of motion.  Neurological: She is alert and oriented to person, place, and time.  Skin: Skin is warm and dry. No rash noted.  Psychiatric: She has a normal mood and affect. Her behavior is  normal. Thought content normal.  Nursing note and vitals reviewed.   BP 110/64   Pulse 70   Temp 97.6 F (36.4 C)   Ht 5' 2.5" (1.588 m)   Wt 142 lb (64.4 kg)   SpO2 95%   BMI 25.56 kg/m   Assessment and Plan: 1. Essential (primary) hypertension Will continue Diovan hct 1/2 tablet per day   Halina Maidens, MD [facility]  06/12/2016

## 2016-06-12 NOTE — Patient Instructions (Signed)
Take 1/2 Diovan daily Monitor BP several times a week

## 2016-06-15 DIAGNOSIS — H2513 Age-related nuclear cataract, bilateral: Secondary | ICD-10-CM | POA: Diagnosis not present

## 2016-10-26 ENCOUNTER — Encounter: Payer: Self-pay | Admitting: Internal Medicine

## 2016-10-26 ENCOUNTER — Ambulatory Visit (INDEPENDENT_AMBULATORY_CARE_PROVIDER_SITE_OTHER): Payer: Medicare Other | Admitting: Internal Medicine

## 2016-10-26 VITALS — BP 124/78 | HR 69 | Ht 62.5 in | Wt 138.0 lb

## 2016-10-26 DIAGNOSIS — I10 Essential (primary) hypertension: Secondary | ICD-10-CM

## 2016-10-26 DIAGNOSIS — Z8679 Personal history of other diseases of the circulatory system: Secondary | ICD-10-CM | POA: Diagnosis not present

## 2016-10-26 DIAGNOSIS — I714 Abdominal aortic aneurysm, without rupture, unspecified: Secondary | ICD-10-CM

## 2016-10-26 DIAGNOSIS — Z9889 Other specified postprocedural states: Secondary | ICD-10-CM | POA: Diagnosis not present

## 2016-10-26 MED ORDER — VALSARTAN 40 MG PO TABS: 40.0000 mg | ORAL_TABLET | Freq: Every day | ORAL | 1 refills | Status: DC

## 2016-10-26 NOTE — Patient Instructions (Addendum)
Goal BP is 115-130.   Try to take medication every day.  Call if several readings on several days in a row are low.  DASH Eating Plan DASH stands for "Dietary Approaches to Stop Hypertension." The DASH eating plan is a healthy eating plan that has been shown to reduce high blood pressure (hypertension). It may also reduce your risk for type 2 diabetes, heart disease, and stroke. The DASH eating plan may also help with weight loss. What are tips for following this plan? General guidelines  Avoid eating more than 2,300 mg (milligrams) of salt (sodium) a day. If you have hypertension, you may need to reduce your sodium intake to 1,500 mg a day.  Limit alcohol intake to no more than 1 drink a day for nonpregnant women and 2 drinks a day for men. One drink equals 12 oz of beer, 5 oz of wine, or 1 oz of hard liquor.  Work with your health care provider to maintain a healthy body weight or to lose weight. Ask what an ideal weight is for you.  Get at least 30 minutes of exercise that causes your heart to beat faster (aerobic exercise) most days of the week. Activities may include walking, swimming, or biking.  Work with your health care provider or diet and nutrition specialist (dietitian) to adjust your eating plan to your individual calorie needs. Reading food labels  Check food labels for the amount of sodium per serving. Choose foods with less than 5 percent of the Daily Value of sodium. Generally, foods with less than 300 mg of sodium per serving fit into this eating plan.  To find whole grains, look for the word "whole" as the first word in the ingredient list. Shopping  Buy products labeled as "low-sodium" or "no salt added."  Buy fresh foods. Avoid canned foods and premade or frozen meals. Cooking  Avoid adding salt when cooking. Use salt-free seasonings or herbs instead of table salt or sea salt. Check with your health care provider or pharmacist before using salt substitutes.  Do not  fry foods. Cook foods using healthy methods such as baking, boiling, grilling, and broiling instead.  Cook with heart-healthy oils, such as olive, canola, soybean, or sunflower oil. Meal planning   Eat a balanced diet that includes: ? 5 or more servings of fruits and vegetables each day. At each meal, try to fill half of your plate with fruits and vegetables. ? Up to 6-8 servings of whole grains each day. ? Less than 6 oz of lean meat, poultry, or fish each day. A 3-oz serving of meat is about the same size as a deck of cards. One egg equals 1 oz. ? 2 servings of low-fat dairy each day. ? A serving of nuts, seeds, or beans 5 times each week. ? Heart-healthy fats. Healthy fats called Omega-3 fatty acids are found in foods such as flaxseeds and coldwater fish, like sardines, salmon, and mackerel.  Limit how much you eat of the following: ? Canned or prepackaged foods. ? Food that is high in trans fat, such as fried foods. ? Food that is high in saturated fat, such as fatty meat. ? Sweets, desserts, sugary drinks, and other foods with added sugar. ? Full-fat dairy products.  Do not salt foods before eating.  Try to eat at least 2 vegetarian meals each week.  Eat more home-cooked food and less restaurant, buffet, and fast food.  When eating at a restaurant, ask that your food be prepared with  less salt or no salt, if possible. What foods are recommended? The items listed may not be a complete list. Talk with your dietitian about what dietary choices are best for you. Grains Whole-grain or whole-wheat bread. Whole-grain or whole-wheat pasta. Brown rice. [place]. Bulgur. Whole-grain and low-sodium cereals. Pita bread. Low-fat, low-sodium crackers. Whole-wheat flour tortillas. Vegetables Fresh or frozen vegetables (raw, steamed, roasted, or grilled). Low-sodium or reduced-sodium tomato and vegetable juice. Low-sodium or reduced-sodium tomato sauce and tomato paste. Low-sodium or  reduced-sodium canned vegetables. Fruits All fresh, dried, or frozen fruit. Canned fruit in natural juice (without added sugar). Meat and other protein foods Skinless chicken or [place]. Ground chicken or [place]. Pork with fat trimmed off. Fish and seafood. Egg whites. Dried beans, peas, or lentils. Unsalted nuts, nut butters, and seeds. Unsalted canned beans. Lean cuts of beef with fat trimmed off. Low-sodium, lean deli meat. Dairy Low-fat (1%) or fat-free (skim) milk. Fat-free, low-fat, or reduced-fat cheeses. Nonfat, low-sodium ricotta or cottage cheese. Low-fat or nonfat yogurt. Low-fat, low-sodium cheese. Fats and oils Soft margarine without trans fats. Vegetable oil. Low-fat, reduced-fat, or light mayonnaise and salad dressings (reduced-sodium). Canola, safflower, olive, soybean, and sunflower oils. Avocado. Seasoning and other foods Herbs. Spices. Seasoning mixes without salt. Unsalted popcorn and pretzels. Fat-free sweets. What foods are not recommended? The items listed may not be a complete list. Talk with your dietitian about what dietary choices are best for you. Grains Baked goods made with fat, such as croissants, muffins, or some breads. Dry pasta or rice meal packs. Vegetables Creamed or fried vegetables. Vegetables in a cheese sauce. Regular canned vegetables (not low-sodium or reduced-sodium). Regular canned tomato sauce and paste (not low-sodium or reduced-sodium). Regular tomato and vegetable juice (not low-sodium or reduced-sodium). Angie Fava. Olives. Fruits Canned fruit in a light or heavy syrup. Fried fruit. Fruit in cream or butter sauce. Meat and other protein foods Fatty cuts of meat. Ribs. Fried meat. Berniece Salines. Sausage. Bologna and other processed lunch meats. Salami. Fatback. Hotdogs. Bratwurst. Salted nuts and seeds. Canned beans with added salt. Canned or smoked fish. Whole eggs or egg yolks. Chicken or [place] with skin. Dairy Whole or 2% milk, cream, and half-and-half.  Whole or full-fat cream cheese. Whole-fat or sweetened yogurt. Full-fat cheese. Nondairy creamers. Whipped toppings. Processed cheese and cheese spreads. Fats and oils Butter. Stick margarine. Lard. Shortening. Ghee. Bacon fat. Tropical oils, such as coconut, palm kernel, or palm oil. Seasoning and other foods Salted popcorn and pretzels. Onion salt, garlic salt, seasoned salt, table salt, and sea salt. Worcestershire sauce. Tartar sauce. Barbecue sauce. Teriyaki sauce. Soy sauce, including reduced-sodium. Steak sauce. Canned and packaged gravies. Fish sauce. Oyster sauce. Cocktail sauce. Horseradish that you find on the shelf. Ketchup. Mustard. Meat flavorings and tenderizers. Bouillon cubes. Hot sauce and Tabasco sauce. Premade or packaged marinades. Premade or packaged taco seasonings. Relishes. Regular salad dressings. Where to find more information:  National Heart, Lung, and [place]: https://wilson-eaton.com/  American Heart Association: www.heart.org Summary  The DASH eating plan is a healthy eating plan that has been shown to reduce high blood pressure (hypertension). It may also reduce your risk for type 2 diabetes, heart disease, and stroke.  With the DASH eating plan, you should limit salt (sodium) intake to 2,300 mg a day. If you have hypertension, you may need to reduce your sodium intake to 1,500 mg a day.  When on the DASH eating plan, aim to eat more fresh fruits and vegetables, whole grains, lean proteins,  low-fat dairy, and heart-healthy fats.  Work with your health care provider or diet and nutrition specialist (dietitian) to adjust your eating plan to your individual calorie needs. This information is not intended to replace advice given to you by your health care provider. Make sure you discuss any questions you have with your health care provider. Document Released: 04/16/2011 Document Revised: 04/20/2016 Document Reviewed: 04/20/2016 Elsevier Interactive Patient Education   2017 [employer].

## 2016-10-26 NOTE — Progress Notes (Signed)
Date:  10/26/2016   Name:  Diana Padilla   DOB:  1934/07/10   MRN:  [id number]   Chief Complaint: Hypotension (X 3 weeks now. Only taking BP meds when Bp is higher. Low Bp readings at home. Last checked this morning and it was 120/77. When gets too low feels fatigued. Has not taking BP meds this morning, ) Hypertension  This is a chronic problem. The problem has been gradually improving since onset. Pertinent negatives include no chest pain, palpitations or shortness of breath. Past treatments include angiotensin blockers and diuretics. The current treatment provides significant improvement.  Taking only half of Diovan HCT daily and often does not take it if BP is in the 120's.    Review of Systems  Constitutional: Negative for chills, fever and unexpected weight change.  Respiratory: Negative for chest tightness, shortness of breath and wheezing.   Cardiovascular: Negative for chest pain, palpitations and leg swelling.  Neurological: Negative for dizziness.  Psychiatric/Behavioral: Negative for sleep disturbance.    Patient Active Problem List   Diagnosis Date Noted  . Age-related osteoporosis without current pathological fracture 04/16/2016  . Absolute anemia 10/16/2015  . Status post percutaneous abdominal aortic aneurysm (AAA) repair 10/16/2015  . Abdominal aortic aneurysm greater than 39 mm in diameter ([place]) 07/10/2015  . Benign essential hematuria 02/24/2015  . Dyslipidemia 02/24/2015  . Current tobacco use 02/24/2015  . Essential (primary) hypertension 02/24/2015  . Transient ischemic attack due to embolism ([place]) 02/24/2015  . Impaired renal function 02/24/2015  . Tobacco use 02/24/2015    Prior to Admission medications   Medication Sig Start Date End Date Taking? Authorizing Provider  Acetaminophen 500 MG coapsule Take by mouth.   Yes [provider]  alendronate (FOSAMAX) 70 MG tablet Take by mouth.   Yes [provider]  aspirin 81 MG chewable  tablet Chew by mouth.   Yes [provider]  Calcium Citrate-Vitamin D (CALCIUM + D PO) Take 1 tablet by mouth daily.   Yes [provider]  Multiple Vitamins-Minerals (CENTRUM SILVER ULTRA WOMENS) TABS Take 1 tablet by mouth daily.   Yes [provider]  omega-3 acid ethyl esters (LOVAZA) 1 g capsule Take by mouth 2 (two) times daily.   Yes [provider]  valsartan-hydrochlorothiazide (DIOVAN-HCT) 80-12.5 MG tablet TAKE 1 TABLET EVERY DAY 05/13/16  Yes Glean Hess, MD  vitamin E 1000 UNIT capsule Take 1 capsule by mouth daily.   Yes [provider]    Allergies  Allergen Reactions  . Ace Inhibitors Cough  . Amlodipine Swelling  . Morphine Nausea And Vomiting    Past Surgical History:  Procedure Laterality Date  . ABDOMINAL AORTIC ANEURYSM REPAIR  07/2015  . TOTAL ABDOMINAL HYSTERECTOMY  1984   fibroids    Social History  Substance Use Topics  . Smoking status: Former Smoker    Packs/day: 0.25    Years: 50.00    Types: Cigarettes    Quit date: 07/10/2015  . Smokeless tobacco: Never Used  . Alcohol use No     Medication list has been reviewed and updated.   Physical Exam  Constitutional: She is oriented to person, place, and time. She appears well-developed. No distress.  HENT:  Head: Normocephalic and atraumatic.  Neck: Normal range of motion. Neck supple. Carotid bruit is not present. No thyromegaly present.  Cardiovascular: Normal rate, regular rhythm and normal heart sounds.   Pulmonary/Chest: Effort normal and breath sounds normal. No respiratory distress.  She has no wheezes.  Musculoskeletal: She exhibits no edema or tenderness.  Neurological: She is alert and oriented to person, place, and time.  Skin: Skin is warm and dry. No rash noted.  Psychiatric: She has a normal mood and affect. Her behavior is normal. Thought content normal.  Nursing note and vitals reviewed.   BP 124/78   Pulse 69   Ht 5' 2.5" (1.588  m)   Wt 138 lb (62.6 kg)   SpO2 96%   BMI 24.84 kg/m   Assessment and Plan: 1. Essential (primary) hypertension Cut medication back to diovan alone - valsartan (DIOVAN) 40 MG tablet; Take 1 tablet (40 mg total) by mouth daily.  Dispense: 90 tablet; Refill: 1  2. Abdominal aortic aneurysm greater than 39 mm in diameter ([place]) repaired   Meds ordered this encounter  Medications  . valsartan (DIOVAN) 40 MG tablet    Sig: Take 1 tablet (40 mg total) by mouth daily.    Dispense:  90 tablet    Refill:  [place], MD [facility]  10/26/2016

## 2016-11-09 DIAGNOSIS — L821 Other seborrheic keratosis: Secondary | ICD-10-CM | POA: Diagnosis not present

## 2016-11-09 DIAGNOSIS — L814 Other melanin hyperpigmentation: Secondary | ICD-10-CM | POA: Diagnosis not present

## 2016-11-09 DIAGNOSIS — D225 Melanocytic nevi of trunk: Secondary | ICD-10-CM | POA: Diagnosis not present

## 2016-11-09 DIAGNOSIS — Z872 Personal history of diseases of the skin and subcutaneous tissue: Secondary | ICD-10-CM | POA: Diagnosis not present

## 2016-11-09 DIAGNOSIS — Z7189 Other specified counseling: Secondary | ICD-10-CM | POA: Diagnosis not present

## 2016-11-09 DIAGNOSIS — L570 Actinic keratosis: Secondary | ICD-10-CM | POA: Diagnosis not present

## 2016-11-19 DIAGNOSIS — Z8679 Personal history of other diseases of the circulatory system: Secondary | ICD-10-CM | POA: Diagnosis not present

## 2016-11-19 DIAGNOSIS — Z95828 Presence of other vascular implants and grafts: Secondary | ICD-10-CM | POA: Diagnosis not present

## 2016-12-14 ENCOUNTER — Other Ambulatory Visit: Payer: Self-pay | Admitting: Internal Medicine

## 2016-12-14 ENCOUNTER — Telehealth: Payer: Self-pay

## 2016-12-14 MED ORDER — LOSARTAN POTASSIUM 50 MG PO TABS: 50.0000 mg | ORAL_TABLET | Freq: Every day | ORAL | 3 refills | Status: AC

## 2016-12-14 NOTE — Telephone Encounter (Signed)
Patient informed. 

## 2016-12-14 NOTE — Telephone Encounter (Signed)
Losartan sent to [facility].

## 2016-12-14 NOTE — Telephone Encounter (Signed)
Patient called about Valsartan recall- needs alternative drug sent in [facility].

## 2017-01-07 DIAGNOSIS — I10 Essential (primary) hypertension: Secondary | ICD-10-CM | POA: Diagnosis not present

## 2017-01-07 DIAGNOSIS — I469 Cardiac arrest, cause unspecified: Secondary | ICD-10-CM | POA: Diagnosis not present

## 2017-01-07 DIAGNOSIS — Z87891 Personal history of nicotine dependence: Secondary | ICD-10-CM | POA: Diagnosis not present

## 2017-01-07 DIAGNOSIS — R4182 Altered mental status, unspecified: Secondary | ICD-10-CM | POA: Diagnosis not present

## 2017-01-07 DIAGNOSIS — Z79899 Other long term (current) drug therapy: Secondary | ICD-10-CM | POA: Diagnosis not present

## 2017-01-09 DEATH — deceased

## 2017-04-28 ENCOUNTER — Encounter: Payer: Self-pay | Admitting: Internal Medicine

## 2017-05-03 ENCOUNTER — Encounter: Payer: Self-pay | Admitting: Internal Medicine

## 2017-05-03 ENCOUNTER — Ambulatory Visit: Payer: Self-pay

## 2017-05-10 ENCOUNTER — Encounter: Payer: Self-pay | Admitting: Internal Medicine

## 2017-05-10 ENCOUNTER — Ambulatory Visit: Payer: Self-pay
